# Patient Record
Sex: Male | Born: 1980 | Race: White | Hispanic: No | Marital: Single | Smoking: Current every day smoker
Health system: Southern US, Community
[De-identification: ages and names within clinical notes are randomized; demographics above are authoritative.]

## PROBLEM LIST (undated history)

## (undated) DIAGNOSIS — M419 Scoliosis, unspecified: Secondary | ICD-10-CM

## (undated) DIAGNOSIS — M1990 Unspecified osteoarthritis, unspecified site: Secondary | ICD-10-CM

## (undated) HISTORY — PX: TONSILLECTOMY: SUR1361

---

## 2014-10-29 ENCOUNTER — Encounter (HOSPITAL_COMMUNITY): Payer: Self-pay | Admitting: Emergency Medicine

## 2014-10-29 ENCOUNTER — Emergency Department (INDEPENDENT_AMBULATORY_CARE_PROVIDER_SITE_OTHER)
Admission: EM | Admit: 2014-10-29 | Discharge: 2014-10-29 | Disposition: A | Discharge status: Home / Self Care | Payer: BLUE CROSS/BLUE SHIELD | Source: Home / Self Care | Attending: Family Medicine | Admitting: Family Medicine

## 2014-10-29 DIAGNOSIS — K088 Other specified disorders of teeth and supporting structures: Secondary | ICD-10-CM

## 2014-10-29 DIAGNOSIS — K0889 Other specified disorders of teeth and supporting structures: Secondary | ICD-10-CM

## 2014-10-29 MED ORDER — AMOXICILLIN 500 MG PO CAPS: 500.0000 mg | ORAL_CAPSULE | Freq: Three times a day (TID) | ORAL | Status: DC

## 2014-10-29 MED ORDER — TRAMADOL HCL 50 MG PO TABS: 50.0000 mg | ORAL_TABLET | Freq: Four times a day (QID) | ORAL | Status: DC | PRN

## 2014-10-29 NOTE — ED Notes (Signed)
C/o dental pain/absess onset 2 days Taking ibup w/no relief Denies fevers, chills.  Alert, no signs of acute distress.

## 2014-10-29 NOTE — Discharge Instructions (Signed)
Thank you for coming in today. °[facility]: ° °[facility] - [phone] (ext [phone]) ° °[address] ° °Please call Dr. Civils office [phone] or cell [phone] °[address], [place]  °Cost for tooth removal $200 includes exam, Xray, and extraction and follow up visit.  °Bring list of current medications with you.  ° °[facility] - [phone] ° °[facility] - [phone] ° °[address] ° °[facility] ° °[address], [place], [place], [address] ° °[phone], Ext. [phone] ° °2nd and 4th Thursday of the month at 6:30am (Simple extractions only - no wisdom teeth or surgery) First come/First serve -First 10 clients served ° °[facility] ([place] residents only) ° °[address], [place], [place], [address] ° °[phone] ° °[facility] ° °[phone] ° °[facility] ° °[phone] ° °[facility] ° °[phone] ° °Please call [facility] at [phone] to get the details to get your tooth pulled.  ° ° ° °Dental Pain °A tooth ache may be caused by cavities (tooth decay). Cavities expose the nerve of the tooth to air and hot or cold temperatures. It may come from an infection or abscess (also called a boil or furuncle) around your tooth. It is also often caused by dental caries (tooth decay). This causes the pain you are having. °DIAGNOSIS  °Your caregiver can diagnose this problem by exam. °TREATMENT  °· If caused by an infection, it may be treated with medications which kill germs (antibiotics) and pain medications as prescribed by your caregiver. Take medications as directed. °· Only take over-the-counter or prescription medicines for pain, discomfort, or fever as directed by your caregiver. °· Whether the tooth ache today is caused by infection or dental disease, you should see your dentist as soon as  possible for further care. °SEEK MEDICAL CARE IF: °The exam and treatment you received today has been provided on an emergency basis only. This is not a substitute for complete medical or dental care. If your problem worsens or new problems (symptoms) appear, and you are unable to meet with your dentist, call or return to this location. °SEEK IMMEDIATE MEDICAL CARE IF:  °· You have a fever. °· You develop redness and swelling of your face, jaw, or neck. °· You are unable to open your mouth. °· You have severe pain uncontrolled by pain medicine. °MAKE SURE YOU:  °· Understand these instructions. °· Will watch your condition. °· Will get help right away if you are not doing well or get worse. °Document Released: 07/08/2005 Document Revised: 09/30/2011 Document Reviewed: 02/24/2008 °ExitCare® Patient Information ©2015 [employer]. This information is not intended to replace advice given to you by your health care provider. Make sure you discuss any questions you have with your health care provider. ° °

## 2014-10-29 NOTE — ED Provider Notes (Signed)
Barbee ShropshireBrian Landgren is a 34 y.o. male who presents to Urgent Care today for dental pain. Patient has severe dental pain in his upper right frontal teeth. He has significantly poor dentition throughout with multiple pulled teeth. He has never been to a dentist for preventative care. He has tried ibuprofen which have helped a little. His teeth began hurting significantly 2 days ago. No fevers or chills vomiting or diarrhea. He has not attempted to contact a dentist.   History reviewed. No pertinent past medical history. History reviewed. No pertinent past surgical history. History  Substance Use Topics  . Smoking status: Current Every Day Smoker -- 1.50 packs/day    Types: Cigarettes  . Smokeless tobacco: Not on file  . Alcohol Use: Yes   ROS as above Medications: No current facility-administered medications for this encounter.   Current Outpatient Prescriptions  Medication Sig Dispense Refill  . amoxicillin (AMOXIL) 500 MG capsule Take 1 capsule (500 mg total) by mouth 3 (three) times daily. 30 capsule 0  . traMADol (ULTRAM) 50 MG tablet Take 1 tablet (50 mg total) by mouth every 6 (six) hours as needed. 15 tablet 0   No Known Allergies   Exam:  BP 147/94 mmHg  Pulse 70  Temp(Src) 98.2 F (36.8 C) (Oral)  Resp 16  SpO2 97%  Gen: Well NAD HEENT: EOMI,  MMM multiple eroded teeth and gum line with gumline erythema. No obvious abscess noted. Right upper incisors tender to touch. Lungs: Normal work of breathing. CTABL Heart: RRR no MRG Abd: NABS, Soft. Nondistended, Nontender Exts: Brisk capillary refill, warm and well perfused.   No results found for this or any previous visit (from the past 24 hour(s)). No results found.  Assessment and Plan: 34 y.o. male with dental pain and infection. Treat with amoxicillin tramadol and NSAIDs. Follow-up with dentist. Return as needed.  Discussed warning signs or symptoms. Please see discharge instructions. Patient expresses  understanding.     Rodolph BongEvan S Talissa Apple, MD 10/29/14 [phone]

## 2015-07-23 HISTORY — PX: ESOPHAGOGASTRODUODENOSCOPY ENDOSCOPY: SHX5814

## 2018-01-15 ENCOUNTER — Ambulatory Visit: Payer: Self-pay | Admitting: Orthopedic Surgery

## 2018-01-15 NOTE — H&P (Signed)
Shawn White is an 37 y.o. male.   Chief Complaint: back and leg pain HPI: Reason for Visit: Diagnositc Results (lumbar MRI)  Context: The patient is 3-4 years out from when symptoms began.  Location (Lower Extremity): lower back pain  Severity: pain level 5/10  Associated Symptoms: numbness/tingling; tender to touch  Medications: The patient is currently taking Robaxin as needed at bedtime. The patient was unable to tolerate the gabapentin.  Past Medical Hx Chronic Back Pain GERD/Reflux  Past Surgical Hx Tonsillectomy  No pertinent family hx  Medications Ativan 0.5 mg tablet Norco 5 mg-325 mg tablet Robaxin 500 mg tablet  Social History:  reports that he has been smoking cigarettes.  He has been smoking about 1.50 packs per day. He does not have any smokeless tobacco history on file. He reports that he drinks alcohol. He reports that he has current or past drug history. Drug: Marijuana.  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   HENT: Negative.   Eyes: Negative.   Respiratory: Negative.   Cardiovascular: Negative.   Gastrointestinal: Negative.   Genitourinary: Negative.   Musculoskeletal: Positive for back pain.  Skin: Negative.   Neurological: Positive for sensory change and focal weakness.  Psychiatric/Behavioral: Negative.     There were no vitals taken for this visit. Physical Exam  Constitutional: He is oriented to person, place, and time. He appears well-developed and well-nourished.  HENT:  Head: Normocephalic.  Eyes: Pupils are equal, round, and reactive to light.  Neck: Normal range of motion.  Cardiovascular: Normal rate.  Respiratory: Effort normal.   GI: Soft.  Musculoskeletal:  Patient is a 37-year-old male.  Gait and Station: Appearance: ambulating with no assistive devices and antalgic gait.  Constitutional: General Appearance: healthy-appearing and distress (mild).  Psychiatric: Mood and Affect: active and alert.  Cardiovascular System: Edema Right: none; Dorsalis and posterior tibial pulses 2+. Edema Left: none.  Abdomen: Inspection and Palpation: non-distended and no tenderness.  Skin: Inspection and palpation: no rash.  Lumbar Spine: Inspection: normal alignment. Bony Palpation of the Lumbar Spine: tender at lumbosacral junction.. Bony Palpation of the Right Hip: no tenderness of the greater trochanter and tenderness of the SI joint; Pelvis stable. Bony Palpation of the Left Hip: no tenderness of the greater trochanter and tenderness of the SI joint. Soft Tissue Palpation on the Right: No flank pain with percussion. Active Range of Motion: limited flexion and extention.  Motor Strength: L1 Motor Strength on the Right: hip flexion iliopsoas 5/5. L1 Motor Strength on the Left: hip flexion iliopsoas 5/5. L2-L4 Motor Strength on the Right: knee extension quadriceps 4/5. L2-L4 Motor Strength on the Left: knee extension quadriceps 5/5. L5 Motor Strength on the Right: great toe extension extensor hallucis longus 5/5 and ankle dorsiflexion tibialis anterior 4/5. L5 Motor Strength on the Left: ankle dorsiflexion tibialis anterior 5/5 and great toe extension extensor hallucis longus 5/5. S1 Motor Strength on the Right: plantar flexion gastrocnemius 5/5. S1 Motor Strength on the Left: plantar flexion gastrocnemius 5/5.  Neurological System: Knee Reflex Right: normal (2). Knee Reflex Left: normal (2). Ankle Reflex Right: normal (2). Ankle Reflex Left: normal (2). Babinski Reflex Right: plantar reflex absent. Babinski Reflex Left: plantar reflex absent. Sensation on the Right: normal   distal extremities and decreased sensation of the knee and  medial leg (L4). Sensation on the Left: normal distal extremities. Special Tests on the Right: no clonus of the ankle/knee and seated straight leg raising test positive. Special Tests on the Left: no clonus of the ankle/knee and seated straight leg raising test positive.  Normal cervical lordosis. No pain with range of motion. No palpable tenderness. Motor is 5/5 in all groups in the upper extremities. Upper extremity sensory exam normal. Patient is normoreflexic in the upper extremities. No Hoffmann sign.  Decreased sensation L4 dermatome.  Neurological: He is alert and oriented to person, place, and time.  Skin: Skin is warm and dry.    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  Assessment/Plan 1. Pain in lumbar spine 2. Congenital postural lordosis 3. Lumbar radiculopathy  4. Degeneration of lumbar intervertebral disc 5. Scoliosis deformity of spine 6. Lumbar spondylolisthesis 7. Nicotine dependence  Patient demonstrates a refractory L4 radiculopathy with weakness in the quadriceps as well as tibialis anterior decreased sensation L4 dermatome.  He is also undergone physical therapy and a dose pack without avail  Given the presence of a neurologic deficit in the presence of a neural compressive lesion and significant pain is reasonable to proceed with a microlumbar decompression at L4-5. This that may require a removal of the hemi-lamina of 4 on the right. He has not underlying spondylolisthesis at L5-S1. And multilevel disc degeneration.  I had an extensive discussion with the patient concerning the pathology relevant anatomy and treatment options. At this point exhausting conservative treatment and in the presence of a neurologic deficit we discussed microlumbar decompression. I discussed the risks and benefits including bleeding, infection, DVT, PE, anesthetic complications, worsening in their symptoms, improvement  in their symptoms, C SF leakage, epidural fibrosis, need for future surgeries such as revision discectomy and lumbar fusion. I also indicated that this is an operation to basically decompress the nerve root to allow recovery as opposed to fixing a herniated disc and that the incidence of recurrent chest disc herniation can approach 15%. Also that nerve root recovery is variable and may not recover completely.  I discussed the operative course including overnight in the hospital. Immediate ambulation. Follow-up in 2 weeks for suture removal. 6 weeks until healing of the herniation followed by 6 weeks of reconditioning and strengthening of the core musculature. Also discussed the need to employ the concepts of disc pressure management and core motion following the surgery to minimize the risk of recurrent disc herniation. We will obtain preoperative clearance i if necessary and proceed accordingly.  I did indicate this is not for disc degeneration or back pain. And that he may require a multilevel fusion in the future. Also discussed the need to refrain from tobacco use indicating the deltoid side effects upon healing including infection scar tissue etc.  In that effort we placed him on Ativan to be utilized as an anxiolytic agent for tobacco withdrawal. He has tried other approaches in the past that have not worked.  We will have him scheduled as soon as possible.  Continue activity modification in the interim we will also place a other dose pack in the interim   Plan microlumbar decompression L4-5  BISSELL, JACLYN M., PA-C for Dr. Beane 01/15/2018, 1:59 PM   

## 2018-01-15 NOTE — H&P (View-Only) (Signed)
Shawn White is an 37 y.o. male.   Chief Complaint: back and leg pain HPI: Reason for Visit: Diagnositc Results (lumbar MRI)  Context: The patient is 3-4 years out from when symptoms began.  Location (Lower Extremity): lower back pain  Severity: pain level 5/10  Associated Symptoms: numbness/tingling; tender to touch  Medications: The patient is currently taking Robaxin as needed at bedtime. The patient was unable to tolerate the gabapentin.  Past Medical Hx Chronic Back Pain GERD/Reflux  Past Surgical Hx Tonsillectomy  No pertinent family hx  Medications Ativan 0.5 mg tablet Norco 5 mg-325 mg tablet Robaxin 500 mg tablet  Social History:  reports that he has been smoking cigarettes.  He has been smoking about 1.50 packs per day. He does not have any smokeless tobacco history on file. He reports that he drinks alcohol. He reports that he has current or past drug history. Drug: Marijuana.  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   HENT: Negative.   Eyes: Negative.   Respiratory: Negative.   Cardiovascular: Negative.   Gastrointestinal: Negative.   Genitourinary: Negative.   Musculoskeletal: Positive for back pain.  Skin: Negative.   Neurological: Positive for sensory change and focal weakness.  Psychiatric/Behavioral: Negative.     There were no vitals taken for this visit. Physical Exam  Constitutional: He is oriented to person, place, and time. He appears well-developed and well-nourished.  HENT:  Head: Normocephalic.  Eyes: Pupils are equal, round, and reactive to light.  Neck: Normal range of motion.  Cardiovascular: Normal rate.  Respiratory: Effort normal.   GI: Soft.  Musculoskeletal:  Patient is a 37 year old male.  Gait and Station: Appearance: ambulating with no assistive devices and antalgic gait.  Constitutional: General Appearance: healthy-appearing and distress (mild).  Psychiatric: Mood and Affect: active and alert.  Cardiovascular System: Edema Right: none; Dorsalis and posterior tibial pulses 2+. Edema Left: none.  Abdomen: Inspection and Palpation: non-distended and no tenderness.  Skin: Inspection and palpation: no rash.  Lumbar Spine: Inspection: normal alignment. Bony Palpation of the Lumbar Spine: tender at lumbosacral junction.. Bony Palpation of the Right Hip: no tenderness of the greater trochanter and tenderness of the SI joint; Pelvis stable. Bony Palpation of the Left Hip: no tenderness of the greater trochanter and tenderness of the SI joint. Soft Tissue Palpation on the Right: No flank pain with percussion. Active Range of Motion: limited flexion and extention.  Motor Strength: L1 Motor Strength on the Right: hip flexion iliopsoas 5/5. L1 Motor Strength on the Left: hip flexion iliopsoas 5/5. L2-L4 Motor Strength on the Right: knee extension quadriceps 4/5. L2-L4 Motor Strength on the Left: knee extension quadriceps 5/5. L5 Motor Strength on the Right: great toe extension extensor hallucis longus 5/5 and ankle dorsiflexion tibialis anterior 4/5. L5 Motor Strength on the Left: ankle dorsiflexion tibialis anterior 5/5 and great toe extension extensor hallucis longus 5/5. S1 Motor Strength on the Right: plantar flexion gastrocnemius 5/5. S1 Motor Strength on the Left: plantar flexion gastrocnemius 5/5.  Neurological System: Knee Reflex Right: normal (2). Knee Reflex Left: normal (2). Ankle Reflex Right: normal (2). Ankle Reflex Left: normal (2). Babinski Reflex Right: plantar reflex absent. Babinski Reflex Left: plantar reflex absent. Sensation on the Right: normal  distal extremities and decreased sensation of the knee and  medial leg (L4). Sensation on the Left: normal distal extremities. Special Tests on the Right: no clonus of the ankle/knee and seated straight leg raising test positive. Special Tests on the Left: no clonus of the ankle/knee and seated straight leg raising test positive.  Normal cervical lordosis. No pain with range of motion. No palpable tenderness. Motor is 5/5 in all groups in the upper extremities. Upper extremity sensory exam normal. Patient is normoreflexic in the upper extremities. No Hoffmann sign.  Decreased sensation L4 dermatome.  Neurological: He is alert and oriented to person, place, and time.  Skin: Skin is warm and dry.    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  Assessment/Plan 1. Pain in lumbar spine 2. Congenital postural lordosis 3. Lumbar radiculopathy  4. Degeneration of lumbar intervertebral disc 5. Scoliosis deformity of spine 6. Lumbar spondylolisthesis 7. Nicotine dependence  Patient demonstrates a refractory L4 radiculopathy with weakness in the quadriceps as well as tibialis anterior decreased sensation L4 dermatome.  He is also undergone physical therapy and a dose pack without avail  Given the presence of a neurologic deficit in the presence of a neural compressive lesion and significant pain is reasonable to proceed with a microlumbar decompression at L4-5. This that may require a removal of the hemi-lamina of 4 on the right. He has not underlying spondylolisthesis at L5-S1. And multilevel disc degeneration.  I had an extensive discussion with the patient concerning the pathology relevant anatomy and treatment options. At this point exhausting conservative treatment and in the presence of a neurologic deficit we discussed microlumbar decompression. I discussed the risks and benefits including bleeding, infection, DVT, PE, anesthetic complications, worsening in their symptoms, improvement  in their symptoms, C SF leakage, epidural fibrosis, need for future surgeries such as revision discectomy and lumbar fusion. I also indicated that this is an operation to basically decompress the nerve root to allow recovery as opposed to fixing a herniated disc and that the incidence of recurrent chest disc herniation can approach 15%. Also that nerve root recovery is variable and may not recover completely.  I discussed the operative course including overnight in the hospital. Immediate ambulation. Follow-up in 2 weeks for suture removal. 6 weeks until healing of the herniation followed by 6 weeks of reconditioning and strengthening of the core musculature. Also discussed the need to employ the concepts of disc pressure management and core motion following the surgery to minimize the risk of recurrent disc herniation. We will obtain preoperative clearance i if necessary and proceed accordingly.  I did indicate this is not for disc degeneration or back pain. And that he may require a multilevel fusion in the future. Also discussed the need to refrain from tobacco use indicating the deltoid side effects upon healing including infection scar tissue etc.  In that effort we placed him on Ativan to be utilized as an anxiolytic agent for tobacco withdrawal. He has tried other approaches in the past that have not worked.  We will have him scheduled as soon as possible.  Continue activity modification in the interim we will also place a other dose pack in the interim   Plan microlumbar decompression L4-5  Dorothy SparkBISSELL, Traves Majchrzak M., PA-C for Dr. Shelle IronBeane 01/15/2018, 1:59 PM

## 2018-01-19 NOTE — Pre-Procedure Instructions (Signed)
Shawn White  01/19/2018      [facility] - [place] [place] [id number] Phone: [phone] Fax: [phone]    Your procedure is scheduled on January 29, 2018.  [facility] at [facility]30 AM.  Call this number if you have problems the morning of surgery:  [phone]   Remember:  Do not eat or drink after midnight.    Take these medicines the morning of surgery with A SIP OF WATER  Hydrocodone-acetaminophen (norco)-if needed for pain Ativan-if needed Methocarbamol (robaxin)-if needed for muscle spasms  7 days prior to surgery STOP taking any Aspirin (unless otherwise instructed by your surgeon), Aleve, Naproxen, Ibuprofen, Motrin, Advil, Goody's, BC's, all herbal medications, fish oil, and all vitamins    Do not wear jewelry  Do not wear lotions, powders, or colognes, or deodorant.  Men may shave face and neck.  Do not bring valuables to the hospital.  [facility] any belongings or valuables.  Contacts, dentures or bridgework may not be worn into surgery.  Leave your suitcase in the car.  After surgery it may be brought to your room.  For patients admitted to the hospital, discharge time will be determined by your treatment team.  Patients discharged the day of surgery will not be allowed to drive home.    [facility]- Preparing For Surgery  Before surgery, you can play an important role. Because skin is not sterile, your skin needs to be as free of germs as possible. You can reduce the number of germs on your skin by washing with CHG (chlorahexidine gluconate) Soap before surgery.  CHG is an antiseptic cleaner which kills germs and bonds with the skin to continue killing germs even after washing.    Oral Hygiene is also important to reduce your risk of infection.  Remember - BRUSH YOUR TEETH THE MORNING OF SURGERY WITH YOUR REGULAR TOOTHPASTE  Please do not use if  you have an allergy to CHG or antibacterial soaps. If your skin becomes reddened/irritated stop using the CHG.  Do not shave (including legs and underarms) for at least 48 hours prior to first CHG shower. It is OK to shave your face.  Please follow these instructions carefully.   1. Shower the NIGHT BEFORE SURGERY and the MORNING OF SURGERY with CHG.   2. If you chose to wash your hair, wash your hair first as usual with your normal shampoo.  3. After you shampoo, rinse your hair and body thoroughly to remove the shampoo.  4. Use CHG as you would any other liquid soap. You can apply CHG directly to the skin and wash gently with a scrungie or a clean washcloth.   5. Apply the CHG Soap to your body ONLY FROM THE NECK DOWN.  Do not use on open wounds or open sores. Avoid contact with your eyes, ears, mouth and genitals (private parts). Wash Face and genitals (private parts)  with your normal soap.  6. Wash thoroughly, paying special attention to the area where your surgery will be performed.  7. Thoroughly rinse your body with warm water from the neck down.  8. DO NOT shower/wash with your normal soap after using and rinsing off the CHG Soap.  9. Pat yourself dry with a CLEAN TOWEL.  10. Wear CLEAN PAJAMAS to bed the night before surgery, wear comfortable clothes the morning of surgery  11. Place  CLEAN SHEETS on your bed the night of your first shower and DO NOT SLEEP WITH PETS.  Day of Surgery:  Do not apply any deodorants/lotions.  Please wear clean clothes to the hospital/surgery center.   Remember to brush your teeth WITH YOUR REGULAR TOOTHPASTE.  Please read over the following fact sheets that you were given. Pain Booklet, Coughing and Deep Breathing, MRSA Information and Surgical Site Infection Prevention

## 2018-01-19 NOTE — Progress Notes (Addendum)
PCP: pt denies   Cardiologist: pt denies  EKG: pt denies past year,  Stress test: pt denies  ECHO: pt denies  Cardiac Cath: pt denies  Chest x-ray: pt denies past year, no recent respiratory infections/complications

## 2018-01-20 ENCOUNTER — Encounter (HOSPITAL_COMMUNITY)
Admission: RE | Admit: 2018-01-20 | Discharge: 2018-01-20 | Disposition: A | Discharge status: Home / Self Care | Payer: BLUE CROSS/BLUE SHIELD | Source: Ambulatory Visit | Attending: Specialist | Admitting: Specialist

## 2018-01-20 ENCOUNTER — Ambulatory Visit (HOSPITAL_COMMUNITY)
Admission: RE | Admit: 2018-01-20 | Discharge: 2018-01-20 | Disposition: A | Discharge status: Home / Self Care | Payer: BLUE CROSS/BLUE SHIELD | Source: Ambulatory Visit | Attending: Orthopedic Surgery | Admitting: Orthopedic Surgery

## 2018-01-20 ENCOUNTER — Encounter (HOSPITAL_COMMUNITY): Payer: Self-pay

## 2018-01-20 ENCOUNTER — Other Ambulatory Visit: Payer: Self-pay

## 2018-01-20 DIAGNOSIS — Z01818 Encounter for other preprocedural examination: Secondary | ICD-10-CM | POA: Diagnosis present

## 2018-01-20 DIAGNOSIS — M47894 Other spondylosis, thoracic region: Secondary | ICD-10-CM | POA: Insufficient documentation

## 2018-01-20 DIAGNOSIS — M5126 Other intervertebral disc displacement, lumbar region: Secondary | ICD-10-CM | POA: Diagnosis present

## 2018-01-20 HISTORY — DX: Unspecified osteoarthritis, unspecified site: M19.90

## 2018-01-20 LAB — BASIC METABOLIC PANEL
ANION GAP: 8 (ref 5–15)
BUN: 10 mg/dL (ref 6–20)
CO2: 25 mmol/L (ref 22–32)
Calcium: 8.9 mg/dL (ref 8.9–10.3)
Chloride: 107 mmol/L (ref 98–111)
Creatinine, Ser: 0.72 mg/dL (ref 0.61–1.24)
GFR calc Af Amer: >60 mL/min (ref >60)
Glucose, Bld: 82 mg/dL (ref 70–99)
Potassium: 4.5 mmol/L (ref 3.5–5.1)
Sodium: 140 mmol/L (ref 135–145)

## 2018-01-20 LAB — CBC
HCT: 45 % (ref 39.0–52.0)
HEMOGLOBIN: 14.5 g/dL (ref 13.0–17.0)
MCH: 30.3 pg (ref 26.0–34.0)
MCHC: 32.2 g/dL (ref 30.0–36.0)
MCV: 93.9 fL (ref 78.0–100.0)
Platelets: 363 10*3/uL (ref 150–400)
RBC: 4.79 MIL/uL (ref 4.22–5.81)
RDW: 12.9 % (ref 11.5–15.5)
WBC: 7 10*3/uL (ref 4.0–10.5)

## 2018-01-20 LAB — SURGICAL PCR SCREEN
MRSA, PCR: NEGATIVE
STAPHYLOCOCCUS AUREUS: NEGATIVE

## 2018-01-29 ENCOUNTER — Encounter (HOSPITAL_COMMUNITY)
Admission: RE | Disposition: A | Discharge status: Home / Self Care | Payer: Self-pay | Source: Ambulatory Visit | Attending: Specialist

## 2018-01-29 ENCOUNTER — Ambulatory Visit (HOSPITAL_COMMUNITY)
Admission: RE | Admit: 2018-01-29 | Discharge: 2018-01-30 | Disposition: A | Discharge status: Home / Self Care | Payer: BLUE CROSS/BLUE SHIELD | Source: Ambulatory Visit | Attending: Specialist | Admitting: Specialist

## 2018-01-29 ENCOUNTER — Encounter (HOSPITAL_COMMUNITY): Payer: Self-pay | Admitting: *Deleted

## 2018-01-29 ENCOUNTER — Ambulatory Visit (HOSPITAL_COMMUNITY): Payer: BLUE CROSS/BLUE SHIELD

## 2018-01-29 ENCOUNTER — Other Ambulatory Visit: Payer: Self-pay

## 2018-01-29 ENCOUNTER — Ambulatory Visit (HOSPITAL_COMMUNITY): Payer: BLUE CROSS/BLUE SHIELD | Admitting: Certified Registered"

## 2018-01-29 DIAGNOSIS — M419 Scoliosis, unspecified: Secondary | ICD-10-CM | POA: Diagnosis not present

## 2018-01-29 DIAGNOSIS — M5126 Other intervertebral disc displacement, lumbar region: Secondary | ICD-10-CM | POA: Diagnosis present

## 2018-01-29 DIAGNOSIS — M4317 Spondylolisthesis, lumbosacral region: Secondary | ICD-10-CM | POA: Insufficient documentation

## 2018-01-29 DIAGNOSIS — M4316 Spondylolisthesis, lumbar region: Secondary | ICD-10-CM | POA: Insufficient documentation

## 2018-01-29 DIAGNOSIS — M5116 Intervertebral disc disorders with radiculopathy, lumbar region: Secondary | ICD-10-CM | POA: Diagnosis not present

## 2018-01-29 DIAGNOSIS — K219 Gastro-esophageal reflux disease without esophagitis: Secondary | ICD-10-CM | POA: Diagnosis not present

## 2018-01-29 DIAGNOSIS — Z79899 Other long term (current) drug therapy: Secondary | ICD-10-CM | POA: Insufficient documentation

## 2018-01-29 DIAGNOSIS — F17210 Nicotine dependence, cigarettes, uncomplicated: Secondary | ICD-10-CM | POA: Insufficient documentation

## 2018-01-29 DIAGNOSIS — Z888 Allergy status to other drugs, medicaments and biological substances status: Secondary | ICD-10-CM | POA: Diagnosis not present

## 2018-01-29 DIAGNOSIS — M48061 Spinal stenosis, lumbar region without neurogenic claudication: Secondary | ICD-10-CM | POA: Diagnosis not present

## 2018-01-29 DIAGNOSIS — Z419 Encounter for procedure for purposes other than remedying health state, unspecified: Secondary | ICD-10-CM

## 2018-01-29 DIAGNOSIS — M1990 Unspecified osteoarthritis, unspecified site: Secondary | ICD-10-CM | POA: Diagnosis not present

## 2018-01-29 HISTORY — PX: LUMBAR DISC SURGERY: SHX700

## 2018-01-29 HISTORY — PX: LUMBAR LAMINECTOMY/DECOMPRESSION MICRODISCECTOMY: SHX5026

## 2018-01-29 HISTORY — DX: Scoliosis, unspecified: M41.9

## 2018-01-29 SURGERY — LUMBAR LAMINECTOMY/DECOMPRESSION MICRODISCECTOMY 1 LEVEL
Anesthesia: General | Site: Spine Lumbar

## 2018-01-29 MED ORDER — LORAZEPAM 0.5 MG PO TABS
0.5000 mg | ORAL_TABLET | Freq: Three times a day (TID) | ORAL | Status: DC | PRN
  Administered 2018-01-29: 0.5 mg via ORAL
  Filled 2018-01-29: qty 1

## 2018-01-29 MED ORDER — RISAQUAD PO CAPS
1.0000 | ORAL_CAPSULE | Freq: Every day | ORAL | Status: DC
  Administered 2018-01-30: 1 via ORAL
  Filled 2018-01-29: qty 1

## 2018-01-29 MED ORDER — SODIUM CHLORIDE 0.9 % IV SOLN
INTRAVENOUS | Status: DC | PRN
  Administered 2018-01-29: 30 ug/min via INTRAVENOUS

## 2018-01-29 MED ORDER — CEFAZOLIN SODIUM-DEXTROSE 2-4 GM/100ML-% IV SOLN: 2.0000 g | INTRAVENOUS | Status: DC

## 2018-01-29 MED ORDER — POLYETHYLENE GLYCOL 3350 17 G PO PACK: 17.0000 g | PACK | Freq: Every day | ORAL | 1 refills | Status: AC

## 2018-01-29 MED ORDER — DEXAMETHASONE SODIUM PHOSPHATE 10 MG/ML IJ SOLN
INTRAMUSCULAR | Status: DC | PRN
  Administered 2018-01-29: 10 mg via INTRAVENOUS

## 2018-01-29 MED ORDER — GLYCOPYRROLATE PF 0.2 MG/ML IJ SOSY
PREFILLED_SYRINGE | INTRAMUSCULAR | Status: AC
  Filled 2018-01-29: qty 2

## 2018-01-29 MED ORDER — VANCOMYCIN HCL IN DEXTROSE 1-5 GM/200ML-% IV SOLN
INTRAVENOUS | Status: AC
  Filled 2018-01-29: qty 200

## 2018-01-29 MED ORDER — THROMBIN 5000 UNITS EX SOLR
CUTANEOUS | Status: AC
  Filled 2018-01-29: qty 20000

## 2018-01-29 MED ORDER — OXYCODONE HCL 5 MG PO TABS
ORAL_TABLET | ORAL | Status: AC
  Filled 2018-01-29: qty 1

## 2018-01-29 MED ORDER — ACETAMINOPHEN 325 MG PO TABS
650.0000 mg | ORAL_TABLET | ORAL | Status: DC | PRN
  Administered 2018-01-29: 650 mg via ORAL
  Filled 2018-01-29: qty 2

## 2018-01-29 MED ORDER — BISACODYL 5 MG PO TBEC
5.0000 mg | DELAYED_RELEASE_TABLET | Freq: Every day | ORAL | Status: DC | PRN
  Administered 2018-01-30: 5 mg via ORAL
  Filled 2018-01-29: qty 1

## 2018-01-29 MED ORDER — METHOCARBAMOL 500 MG PO TABS: 500.0000 mg | ORAL_TABLET | Freq: Four times a day (QID) | ORAL | 1 refills | Status: AC | PRN

## 2018-01-29 MED ORDER — MIDAZOLAM HCL 2 MG/2ML IJ SOLN
INTRAMUSCULAR | Status: AC
  Filled 2018-01-29: qty 2

## 2018-01-29 MED ORDER — FENTANYL CITRATE (PF) 100 MCG/2ML IJ SOLN
INTRAMUSCULAR | Status: DC | PRN
  Administered 2018-01-29: 200 ug via INTRAVENOUS
  Administered 2018-01-29 (×2): 25 ug via INTRAVENOUS

## 2018-01-29 MED ORDER — OXYCODONE HCL 5 MG PO TABS
5.0000 mg | ORAL_TABLET | ORAL | Status: DC | PRN
  Administered 2018-01-29 – 2018-01-30 (×4): 10 mg via ORAL
  Filled 2018-01-29 (×4): qty 2

## 2018-01-29 MED ORDER — HYDROMORPHONE HCL 1 MG/ML IJ SOLN
0.2500 mg | INTRAMUSCULAR | Status: DC | PRN
Start: 2018-01-29 — End: 2018-01-29
  Administered 2018-01-29 (×2): 0.5 mg via INTRAVENOUS

## 2018-01-29 MED ORDER — METHOCARBAMOL 1000 MG/10ML IJ SOLN
500.0000 mg | Freq: Four times a day (QID) | INTRAVENOUS | Status: DC | PRN
  Filled 2018-01-29: qty 5

## 2018-01-29 MED ORDER — ALUM & MAG HYDROXIDE-SIMETH 200-200-20 MG/5ML PO SUSP: 30.0000 mL | Freq: Four times a day (QID) | ORAL | Status: DC | PRN

## 2018-01-29 MED ORDER — MAGNESIUM CITRATE PO SOLN: 1.0000 | Freq: Once | ORAL | Status: DC | PRN

## 2018-01-29 MED ORDER — BUPIVACAINE-EPINEPHRINE 0.5% -1:200000 IJ SOLN
INTRAMUSCULAR | Status: DC | PRN
  Administered 2018-01-29: 6 mL

## 2018-01-29 MED ORDER — FENTANYL CITRATE (PF) 250 MCG/5ML IJ SOLN
INTRAMUSCULAR | Status: AC
  Filled 2018-01-29: qty 5

## 2018-01-29 MED ORDER — ACETAMINOPHEN 650 MG RE SUPP: 650.0000 mg | RECTAL | Status: DC | PRN

## 2018-01-29 MED ORDER — 0.9 % SODIUM CHLORIDE (POUR BTL) OPTIME
TOPICAL | Status: DC | PRN
  Administered 2018-01-29: 1000 mL

## 2018-01-29 MED ORDER — SUGAMMADEX SODIUM 200 MG/2ML IV SOLN
INTRAVENOUS | Status: AC
  Filled 2018-01-29: qty 2

## 2018-01-29 MED ORDER — PROPOFOL 10 MG/ML IV BOLUS
INTRAVENOUS | Status: AC
  Filled 2018-01-29: qty 20

## 2018-01-29 MED ORDER — CEFAZOLIN SODIUM-DEXTROSE 2-4 GM/100ML-% IV SOLN
2.0000 g | INTRAVENOUS | Status: AC
  Administered 2018-01-29: 2 g via INTRAVENOUS
  Filled 2018-01-29: qty 100

## 2018-01-29 MED ORDER — CEFAZOLIN SODIUM-DEXTROSE 2-4 GM/100ML-% IV SOLN
2.0000 g | Freq: Three times a day (TID) | INTRAVENOUS | Status: AC
Start: 2018-01-29 — End: 2018-01-30
  Administered 2018-01-29 – 2018-01-30 (×2): 2 g via INTRAVENOUS
  Filled 2018-01-29 (×2): qty 100

## 2018-01-29 MED ORDER — ONDANSETRON HCL 4 MG/2ML IJ SOLN
4.0000 mg | Freq: Four times a day (QID) | INTRAMUSCULAR | Status: DC | PRN
  Administered 2018-01-29 – 2018-01-30 (×2): 4 mg via INTRAVENOUS
  Filled 2018-01-29 (×2): qty 2

## 2018-01-29 MED ORDER — ONDANSETRON HCL 4 MG/2ML IJ SOLN
INTRAMUSCULAR | Status: DC | PRN
  Administered 2018-01-29: 4 mg via INTRAVENOUS

## 2018-01-29 MED ORDER — PROPOFOL 10 MG/ML IV BOLUS
INTRAVENOUS | Status: DC | PRN
  Administered 2018-01-29 (×2): 100 mg via INTRAVENOUS

## 2018-01-29 MED ORDER — PROMETHAZINE HCL 25 MG/ML IJ SOLN: 6.2500 mg | INTRAMUSCULAR | Status: DC | PRN

## 2018-01-29 MED ORDER — ROCURONIUM BROMIDE 100 MG/10ML IV SOLN
INTRAVENOUS | Status: DC | PRN
  Administered 2018-01-29 (×2): 10 mg via INTRAVENOUS
  Administered 2018-01-29: 50 mg via INTRAVENOUS
  Administered 2018-01-29 (×2): 10 mg via INTRAVENOUS

## 2018-01-29 MED ORDER — THROMBIN 5000 UNITS EX SOLR
CUTANEOUS | Status: DC | PRN
  Administered 2018-01-29 (×4): 5000 [IU] via TOPICAL

## 2018-01-29 MED ORDER — ONDANSETRON HCL 4 MG PO TABS: 4.0000 mg | ORAL_TABLET | Freq: Four times a day (QID) | ORAL | Status: DC | PRN

## 2018-01-29 MED ORDER — MENTHOL 3 MG MT LOZG: 1.0000 | LOZENGE | OROMUCOSAL | Status: DC | PRN

## 2018-01-29 MED ORDER — KCL IN DEXTROSE-NACL 20-5-0.45 MEQ/L-%-% IV SOLN
INTRAVENOUS | Status: DC
  Administered 2018-01-29: 16:00:00 via INTRAVENOUS
  Filled 2018-01-29: qty 1000

## 2018-01-29 MED ORDER — BUPIVACAINE-EPINEPHRINE (PF) 0.5% -1:200000 IJ SOLN
INTRAMUSCULAR | Status: AC
  Filled 2018-01-29: qty 30

## 2018-01-29 MED ORDER — LIDOCAINE HCL (CARDIAC) PF 100 MG/5ML IV SOSY
PREFILLED_SYRINGE | INTRAVENOUS | Status: DC | PRN
  Administered 2018-01-29: 80 mg via INTRAVENOUS
  Administered 2018-01-29: 20 mg via INTRAVENOUS

## 2018-01-29 MED ORDER — POLYETHYLENE GLYCOL 3350 17 G PO PACK: 17.0000 g | PACK | Freq: Every day | ORAL | Status: DC | PRN

## 2018-01-29 MED ORDER — DOCUSATE SODIUM 100 MG PO CAPS: 100.0000 mg | ORAL_CAPSULE | Freq: Two times a day (BID) | ORAL | 1 refills | Status: AC | PRN

## 2018-01-29 MED ORDER — MIDAZOLAM HCL 5 MG/5ML IJ SOLN
INTRAMUSCULAR | Status: DC | PRN
  Administered 2018-01-29: 2 mg via INTRAVENOUS

## 2018-01-29 MED ORDER — METHOCARBAMOL 500 MG PO TABS
ORAL_TABLET | ORAL | Status: AC
  Filled 2018-01-29: qty 1

## 2018-01-29 MED ORDER — LACTATED RINGERS IV SOLN
INTRAVENOUS | Status: DC
  Administered 2018-01-29 (×2): via INTRAVENOUS

## 2018-01-29 MED ORDER — OXYCODONE HCL 5 MG/5ML PO SOLN: 5.0000 mg | Freq: Once | ORAL | Status: AC | PRN

## 2018-01-29 MED ORDER — OXYCODONE HCL 5 MG PO TABS: 5.0000 mg | ORAL_TABLET | ORAL | 0 refills | Status: AC | PRN

## 2018-01-29 MED ORDER — HEMOSTATIC AGENTS (NO CHARGE) OPTIME
TOPICAL | Status: DC | PRN
  Administered 2018-01-29: 1 via TOPICAL

## 2018-01-29 MED ORDER — VANCOMYCIN HCL IN DEXTROSE 1-5 GM/200ML-% IV SOLN: 1000.0000 mg | INTRAVENOUS | Status: DC

## 2018-01-29 MED ORDER — PHENOL 1.4 % MT LIQD: 1.0000 | OROMUCOSAL | Status: DC | PRN

## 2018-01-29 MED ORDER — GLYCOPYRROLATE 0.2 MG/ML IJ SOLN
INTRAMUSCULAR | Status: DC | PRN
  Administered 2018-01-29: 0.2 mg via INTRAVENOUS

## 2018-01-29 MED ORDER — DOCUSATE SODIUM 100 MG PO CAPS
100.0000 mg | ORAL_CAPSULE | Freq: Two times a day (BID) | ORAL | Status: DC
  Administered 2018-01-29 – 2018-01-30 (×2): 100 mg via ORAL
  Filled 2018-01-29 (×2): qty 1

## 2018-01-29 MED ORDER — SUGAMMADEX SODIUM 500 MG/5ML IV SOLN
INTRAVENOUS | Status: DC | PRN
  Administered 2018-01-29: 200 mg via INTRAVENOUS

## 2018-01-29 MED ORDER — DEXMEDETOMIDINE HCL 200 MCG/2ML IV SOLN
INTRAVENOUS | Status: DC | PRN
  Administered 2018-01-29 (×5): 8 ug via INTRAVENOUS

## 2018-01-29 MED ORDER — ACETAMINOPHEN 10 MG/ML IV SOLN
1000.0000 mg | INTRAVENOUS | Status: AC
  Administered 2018-01-29: 1000 mg via INTRAVENOUS
  Filled 2018-01-29: qty 100

## 2018-01-29 MED ORDER — METHOCARBAMOL 500 MG PO TABS
500.0000 mg | ORAL_TABLET | Freq: Four times a day (QID) | ORAL | Status: DC | PRN
  Administered 2018-01-29 (×2): 500 mg via ORAL
  Filled 2018-01-29: qty 1

## 2018-01-29 MED ORDER — HYDROMORPHONE HCL 1 MG/ML IJ SOLN
INTRAMUSCULAR | Status: AC
  Filled 2018-01-29: qty 1

## 2018-01-29 MED ORDER — HYDROMORPHONE HCL 1 MG/ML IJ SOLN
1.0000 mg | INTRAMUSCULAR | Status: DC | PRN
  Administered 2018-01-29 (×2): 1 mg via INTRAVENOUS
  Filled 2018-01-29 (×2): qty 1

## 2018-01-29 MED ORDER — BACITRACIN 50000 UNITS IM SOLR
INTRAMUSCULAR | Status: DC | PRN
  Administered 2018-01-29: 500 mL

## 2018-01-29 MED ORDER — OXYCODONE HCL 5 MG PO TABS
5.0000 mg | ORAL_TABLET | Freq: Once | ORAL | Status: AC | PRN
  Administered 2018-01-29: 5 mg via ORAL

## 2018-01-29 MED ORDER — LACTATED RINGERS IV SOLN
INTRAVENOUS | Status: DC
  Administered 2018-01-29: 09:00:00 via INTRAVENOUS

## 2018-01-29 SURGICAL SUPPLY — 58 items
BAG DECANTER FOR FLEXI CONT (MISCELLANEOUS) ×3 IMPLANT
CLEANER TIP ELECTROSURG 2X2 (MISCELLANEOUS) ×3 IMPLANT
CLOSURE WOUND 1/2 X4 (GAUZE/BANDAGES/DRESSINGS) ×1
CLOTH 2% CHLOROHEXIDINE 3PK (PERSONAL CARE ITEMS) ×3 IMPLANT
CONT SPEC 4OZ CLIKSEAL STRL BL (MISCELLANEOUS) ×3 IMPLANT
DRAPE LAPAROTOMY 100X72X124 (DRAPES) ×3 IMPLANT
DRAPE MICROSCOPE LEICA (MISCELLANEOUS) ×3 IMPLANT
DRAPE SHEET LG 3/4 BI-LAMINATE (DRAPES) ×3 IMPLANT
DRAPE SURG 17X11 SM STRL (DRAPES) ×3 IMPLANT
DRAPE UTILITY XL STRL (DRAPES) ×3 IMPLANT
DRSG AQUACEL AG ADV 3.5X 4 (GAUZE/BANDAGES/DRESSINGS) ×3 IMPLANT
DRSG AQUACEL AG ADV 3.5X 6 (GAUZE/BANDAGES/DRESSINGS) IMPLANT
DRSG TELFA 3X8 NADH (GAUZE/BANDAGES/DRESSINGS) IMPLANT
DURAPREP 26ML APPLICATOR (WOUND CARE) ×3 IMPLANT
DURASEAL SPINE SEALANT 3ML (MISCELLANEOUS) IMPLANT
ELECT BLADE 4.0 EZ CLEAN MEGAD (MISCELLANEOUS) ×3
ELECT REM PT RETURN 9FT ADLT (ELECTROSURGICAL) ×3
ELECTRODE BLDE 4.0 EZ CLN MEGD (MISCELLANEOUS) ×1 IMPLANT
ELECTRODE REM PT RTRN 9FT ADLT (ELECTROSURGICAL) ×1 IMPLANT
GLOVE BIO SURGEON STRL SZ 6.5 (GLOVE) ×2 IMPLANT
GLOVE BIO SURGEONS STRL SZ 6.5 (GLOVE) ×1
GLOVE BIOGEL PI IND STRL 6.5 (GLOVE) ×1 IMPLANT
GLOVE BIOGEL PI IND STRL 7.0 (GLOVE) ×1 IMPLANT
GLOVE BIOGEL PI IND STRL 7.5 (GLOVE) ×1 IMPLANT
GLOVE BIOGEL PI INDICATOR 6.5 (GLOVE) ×2
GLOVE BIOGEL PI INDICATOR 7.0 (GLOVE) ×2
GLOVE BIOGEL PI INDICATOR 7.5 (GLOVE) ×2
GLOVE SURG SS PI 7.0 STRL IVOR (GLOVE) ×3 IMPLANT
GLOVE SURG SS PI 7.5 STRL IVOR (GLOVE) ×3 IMPLANT
GLOVE SURG SS PI 8.0 STRL IVOR (GLOVE) ×6 IMPLANT
GOWN STRL REUS W/ TWL LRG LVL3 (GOWN DISPOSABLE) ×4 IMPLANT
GOWN STRL REUS W/ TWL XL LVL3 (GOWN DISPOSABLE) IMPLANT
GOWN STRL REUS W/TWL LRG LVL3 (GOWN DISPOSABLE) ×8
GOWN STRL REUS W/TWL XL LVL3 (GOWN DISPOSABLE)
IV CATH 14GX2 1/4 (CATHETERS) ×3 IMPLANT
KIT BASIN OR (CUSTOM PROCEDURE TRAY) ×3 IMPLANT
KIT POSITION SURG JACKSON T1 (MISCELLANEOUS) ×3 IMPLANT
NEEDLE 22X1 1/2 (OR ONLY) (NEEDLE) ×3 IMPLANT
NEEDLE SPNL 18GX3.5 QUINCKE PK (NEEDLE) ×6 IMPLANT
PACK LAMINECTOMY NEURO (CUSTOM PROCEDURE TRAY) ×3 IMPLANT
PATTIES SURGICAL .75X.75 (GAUZE/BANDAGES/DRESSINGS) ×3 IMPLANT
RUBBERBAND STERILE (MISCELLANEOUS) ×6 IMPLANT
SPONGE LAP 4X18 RFD (DISPOSABLE) IMPLANT
SPONGE SURGIFOAM ABS GEL 100 (HEMOSTASIS) ×3 IMPLANT
STAPLER VISISTAT (STAPLE) IMPLANT
STRIP CLOSURE SKIN 1/2X4 (GAUZE/BANDAGES/DRESSINGS) ×2 IMPLANT
SUT NURALON 4 0 TR CR/8 (SUTURE) IMPLANT
SUT PROLENE 3 0 PS 2 (SUTURE) ×3 IMPLANT
SUT VIC AB 1 CT1 27 (SUTURE) ×4
SUT VIC AB 1 CT1 27XBRD ANTBC (SUTURE) ×2 IMPLANT
SUT VIC AB 1-0 CT2 27 (SUTURE) IMPLANT
SUT VIC AB 2-0 CT1 27 (SUTURE)
SUT VIC AB 2-0 CT1 TAPERPNT 27 (SUTURE) IMPLANT
SUT VIC AB 2-0 CT2 27 (SUTURE) ×3 IMPLANT
SYR 3ML LL SCALE MARK (SYRINGE) ×3 IMPLANT
TOWEL GREEN STERILE (TOWEL DISPOSABLE) ×3 IMPLANT
TOWEL GREEN STERILE FF (TOWEL DISPOSABLE) ×3 IMPLANT
YANKAUER SUCT BULB TIP NO VENT (SUCTIONS) ×3 IMPLANT

## 2018-01-29 NOTE — Transfer of Care (Signed)
Immediate Anesthesia Transfer of Care Note  Patient: Shawn White  Procedure(s) Performed: Microlumbar decompression Lumbar four-Five (N/A Spine Lumbar)  Patient Location: PACU  Anesthesia Type:General  Level of Consciousness: awake, alert  and oriented  Airway & Oxygen Therapy: Patient Spontanous Breathing and Patient connected to face mask oxygen  Post-op Assessment: Report given to RN, Post -op Vital signs reviewed and stable and Patient moving all extremities X 4  Post vital signs: Reviewed and stable  Last Vitals:  Vitals Value Taken Time  BP    Temp    Pulse    Resp    SpO2      Last Pain:  Vitals:   01/29/18 0816  TempSrc: Oral         Complications: No apparent anesthesia complications

## 2018-01-29 NOTE — Discharge Instructions (Signed)

## 2018-01-29 NOTE — Progress Notes (Signed)
Pt noted to have saturated aquacel dressing to back. Small spot of blood visible on the sheets. Called IT trainerrtho surgeon. Orders to reinforce the dressing with ABD and tape and apply ice pack to the area. Pt alert, oriented, denies numbness/tingling at this time. Able to walk to and from the bathroom. Will continue to monitor.

## 2018-01-29 NOTE — Brief Op Note (Signed)
01/29/2018  12:57 PM  PATIENT:  Barbee ShropshireBrian Buttram  [age] y.o. 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  POST-OPERATIVE DIAGNOSIS:  Herniated Nucleous Pulposus Lumbar Four-Five  PROCEDURE:  Procedure(s): Microlumbar decompression Lumbar four-Five (N/A)  SURGEON:  Surgeon(s) and Role:    Jene Every* Dorie Ohms, MD - Primary  PHYSICIAN ASSISTANT:   ASSISTANTS: Bissell   ANESTHESIA:   general  EBL:  25  BLOOD ADMINISTERED:none  DRAINS: none   LOCAL MEDICATIONS USED:  MARCAINE     SPECIMEN:  Source of Specimen:  L45  DISPOSITION OF SPECIMEN:  PATHOLOGY  COUNTS:  YES  TOURNIQUET:  * No tourniquets in log *  DICTATION: .Other Dictation: Dictation Number [id number]  PLAN OF CARE: Admit for overnight observation  PATIENT DISPOSITION:  PACU - hemodynamically stable.   Delay start of Pharmacological VTE agent (>24hrs) due to surgical blood loss or risk of bleeding: yes

## 2018-01-29 NOTE — Anesthesia Preprocedure Evaluation (Addendum)
Anesthesia Evaluation  Patient identified by MRN, date of birth, ID band Patient awake    Reviewed: Allergy & Precautions, NPO status , Patient's Chart, lab work & pertinent test results  Airway Mallampati: II  TM Distance: >3 FB Neck ROM: Full    Dental  (+) Poor Dentition, Missing   Pulmonary Current Smoker,    Pulmonary exam normal breath sounds clear to auscultation       Cardiovascular negative cardio ROS Normal cardiovascular exam Rhythm:Regular Rate:Normal     Neuro/Psych negative neurological ROS  negative psych ROS   GI/Hepatic negative GI ROS, Neg liver ROS,   Endo/Other  negative endocrine ROS  Renal/GU negative Renal ROS     Musculoskeletal negative musculoskeletal ROS (+)   Abdominal   Peds  Hematology negative hematology ROS (+)   Anesthesia Other Findings HNP L4-5  Reproductive/Obstetrics                            Anesthesia Physical Anesthesia Plan  ASA: II  Anesthesia Plan: General   Post-op Pain Management:    Induction: Intravenous  PONV Risk Score and Plan: 1 and Midazolam, Dexamethasone, Ondansetron and Treatment may vary due to age or medical condition  Airway Management Planned: Oral ETT  Additional Equipment:   Intra-op Plan:   Post-operative Plan: Extubation in OR  Informed Consent: I have reviewed the patients History and Physical, chart, labs and discussed the procedure including the risks, benefits and alternatives for the proposed anesthesia with the patient or authorized representative who has indicated his/her understanding and acceptance.   Dental advisory given  Plan Discussed with: CRNA  Anesthesia Plan Comments:         Anesthesia Quick Evaluation

## 2018-01-29 NOTE — Plan of Care (Addendum)
1420 Received pt from PACU, A&O x4. Denies numbness or tingling to BLE, with good and equal dorsiflexion, no weakness.   Surgical pain is not controlled. Placed a call to OmnicomJaclyn Bissell PA. New pain meds ordered.     Problem: Safety: Goal: Ability to remain free from injury will improve Outcome: Progressing

## 2018-01-29 NOTE — Progress Notes (Signed)
Patient has vancomycin and Ancef ordered for prophylactic antibiotics.  Patient denies penicillin allergy and denies history of MRSA.  Dr. Shelle IronBeane notified and received verbal order to discontinue vancomycin.

## 2018-01-29 NOTE — Evaluation (Signed)
Physical Therapy Evaluation and Discharge  Patient Details Name: Shawn White MRN: [id number] DOB: [birth year] Today's Date: 01/29/2018   History of Present Illness  Pt is a 37 y/o male s/p L4-5 microlumbar decompression and microdiskectomy. PMH includes current smoker.   Clinical Impression  Patient evaluated by Physical Therapy with no further acute PT needs identified. All education has been completed and the patient has no further questions. Pt requiring supervision for all mobility and overall steady. Reviewed back precautions and supine HEP. Reports brother and other family can help as needed upon d/c. See below for any follow-up Physical Therapy or equipment needs. PT is signing off. Thank you for this referral. If needs change, please reconsult.     Follow Up Recommendations No PT follow up    Equipment Recommendations  None recommended by PT    Recommendations for Other Services       Precautions / Restrictions Precautions Precautions: Back Precaution Booklet Issued: Yes (comment) Precaution Comments: Reviewed back precautions with pt and family.  Restrictions Weight Bearing Restrictions: No      Mobility  Bed Mobility Overal bed mobility: Needs Assistance Bed Mobility: Rolling;Sidelying to Sit;Sit to Sidelying Rolling: Supervision Sidelying to sit: Supervision     Sit to sidelying: Supervision General bed mobility comments: Supervision to ensure log roll technique.   Transfers Overall transfer level: Needs assistance Equipment used: None Transfers: Sit to/from Stand Sit to Stand: Supervision         General transfer comment: Supervision for safety. No LOB noted.   Ambulation/Gait Ambulation/Gait assistance: Supervision Gait Distance (Feet): 200 Feet Assistive device: None Gait Pattern/deviations: Step-through pattern Gait velocity: Decreased    General Gait Details: Slow, guarded gait, however, no LOB noted. Educated about generalized walking  program to perform at home.   Stairs            Wheelchair Mobility    Modified Rankin (Stroke Patients Only)       Balance Overall balance assessment: Needs assistance Sitting-balance support: No upper extremity supported;Feet supported Sitting balance-Leahy Scale: Normal     Standing balance support: No upper extremity supported;During functional activity Standing balance-Leahy Scale: Good                               Pertinent Vitals/Pain Pain Assessment: Faces Faces Pain Scale: Hurts even more Pain Location: back  Pain Descriptors / Indicators: Operative site guarding;Sore Pain Intervention(s): Limited activity within patient's tolerance;Monitored during session;Repositioned    Home Living Family/patient expects to be discharged to:: Private residence Living Arrangements: Other relatives Available Help at Discharge: Family;Available PRN/intermittently Type of Home: House Home Access: Level entry     Home Layout: Able to live on main level with bedroom/bathroom;Two level Home Equipment: Cane - single point;Walker - 2 wheels      Prior Function Level of Independence: Independent               Hand Dominance   Dominant Hand: Right    Extremity/Trunk Assessment   Upper Extremity Assessment Upper Extremity Assessment: Defer to OT evaluation    Lower Extremity Assessment Lower Extremity Assessment: RLE deficits/detail RLE Deficits / Details: Reports RLE pain had improved, however, still felt some pressure.     Cervical / Trunk Assessment Cervical / Trunk Assessment: Other exceptions Cervical / Trunk Exceptions: s/p lumbar surgery  Communication   Communication: No difficulties  Cognition Arousal/Alertness: Awake/alert Behavior During Therapy: WFL for tasks assessed/performed Overall Cognitive  Status: Within Functional Limits for tasks assessed                                        General Comments General  comments (skin integrity, edema, etc.): Pt's dad and brother present during session.     Exercises     Assessment/Plan    PT Assessment Patent does not need any further PT services  PT Problem List         PT Treatment Interventions      PT Goals (Current goals can be found in the Care Plan section)  Acute Rehab PT Goals Patient Stated Goal: to go home  PT Goal Formulation: With patient Time For Goal Achievement: 01/29/18 Potential to Achieve Goals: Good    Frequency     Barriers to discharge        Co-evaluation               AM-PAC PT "6 Clicks" Daily Activity  Outcome Measure Difficulty turning over in bed (including adjusting bedclothes, sheets and blankets)?: None Difficulty moving from lying on back to sitting on the side of the bed? : A Little Difficulty sitting down on and standing up from a chair with arms (e.g., wheelchair, bedside commode, etc,.)?: None Help needed moving to and from a bed to chair (including a wheelchair)?: None Help needed walking in hospital room?: None Help needed climbing 3-5 steps with a railing? : A Little 6 Click Score: 22    End of Session Equipment Utilized During Treatment: Gait belt Activity Tolerance: Patient tolerated treatment well Patient left: in bed;with call bell/phone within reach;with family/visitor present Nurse Communication: Mobility status PT Visit Diagnosis: Other abnormalities of gait and mobility (R26.89);Pain Pain - part of body: (back )    Time: [id number] PT Time Calculation (min) (ACUTE ONLY): 15 min   Charges:   PT Evaluation $PT Eval Low Complexity: 1 Low     PT G Codes:        Gladys DammeBrittany Celia Gibbons, PT, DPT  Acute Rehabilitation Services  Pager: [phone]   Lehman PromBrittany S Marlaya Turck 01/29/2018, 5:42 PM

## 2018-01-29 NOTE — Anesthesia Postprocedure Evaluation (Signed)
Anesthesia Post Note  Patient: Barbee ShropshireBrian Pettibone  Procedure(s) Performed: Microlumbar decompression Lumbar four-Five (N/A Spine Lumbar)     Patient location during evaluation: PACU Anesthesia Type: General Level of consciousness: awake and alert Pain management: pain level controlled Vital Signs Assessment: post-procedure vital signs reviewed and stable Respiratory status: spontaneous breathing, nonlabored ventilation, respiratory function stable and patient connected to nasal cannula oxygen Cardiovascular status: blood pressure returned to baseline and stable Postop Assessment: no apparent nausea or vomiting Anesthetic complications: no    Last Vitals:  Vitals:   01/29/18 1336 01/29/18 1358  BP: 129/81 110/90  Pulse: 62 76  Resp: 15 18  Temp: 36.7 C (!) 36.4 C  SpO2: 96% 100%    Last Pain:  Vitals:   01/29/18 1638  TempSrc:   PainSc: 4                  Ryan P Ellender

## 2018-01-29 NOTE — Anesthesia Procedure Notes (Signed)
Procedure Name: Intubation Performed by: Neldon Newport, CRNA Pre-anesthesia Checklist: Patient identified, Emergency Drugs available, Suction available and Patient being monitored Patient Re-evaluated:Patient Re-evaluated prior to induction Oxygen Delivery Method: Circle System Utilized Preoxygenation: Pre-oxygenation with 100% oxygen Induction Type: IV induction Ventilation: Mask ventilation without difficulty Laryngoscope Size: Mac and 3 Grade View: Grade I Tube type: Oral Tube size: 7.5 mm Number of attempts: 1 Airway Equipment and Method: Stylet and Oral airway Placement Confirmation: ETT inserted through vocal cords under direct vision,  positive ETCO2 and breath sounds checked- equal and bilateral Secured at: 23 cm Tube secured with: Tape Dental Injury: Teeth and Oropharynx as per pre-operative assessment

## 2018-01-29 NOTE — Op Note (Signed)
NAMEAVONTAE, Shawn White MEDICAL RECORD [id number] ACCOUNT [id number] DATE OF BIRTH:01-May-1981 FACILITY: [facility] LOCATION: [facility] PHYSICIAN:Edwinna Rochette Connye Burkitt, MD  OPERATIVE REPORT  DATE OF PROCEDURE:  01/29/2018  PREOPERATIVE DIAGNOSIS:  Spinal stenosis HNP L4-L5, right.  POSTOPERATIVE DIAGNOSIS:  Spinal stenosis pulposus L4-L5 right.  PROCEDURE PERFORMED: 1.  Microlumbar decompression L4-L5 right. 2.  Microdiskectomy 4-5 right. 3.  Hemilaminectomy L4 right.  ANESTHESIA:  General.  ASSISTANT:  Andrez Grime, PA   HISTORY:  This is a 37 year old male with disk herniation at 4-5 migrating cephalad compressed in the 4 root out into the foramen.  He also had scoliosis, increased lumbosacral angle and a grade I spondylolisthesis at L5-S1.  He was indicated for  microlumbar decompression with neural tension signs, quad weakness, tibialis anterior weakness and numbness.  Risks and benefits discussed including bleeding, infection, damage to neurovascular structures, no change in symptoms, worsening symptoms, DVT,  PE, anesthetic complications, etc.  TECHNIQUE:  The patient in supine position.  After induction of adequate anesthesia, 2 grams Kefzol placed prone on the [place] table with sling.  Hips flexed.  Abdomen free.  Lumbar region was prepped and draped in the usual sterile fashion.  Two  18-gauge spinal needles were utilized to localize the 4-5 interspace confirmed with x-rays.  Incision was made above the spinous process of 4 to below 5.  Subcutaneous tissue was dissected with electrocautery to achieve hemostasis.  Dorsal lumbar fascia  identified and divided in line with the skin incision was infiltrated with 0.25% Marcaine with epinephrine.  Paraspinous muscle elevated from the lamina of 4-5 and above the lamina of 4 as the fragment had migrated cephalad to above the pedicle of 4 and  near to the disk space at 3-4.  Confirmatory radiograph obtained.  Noted was a near vertical shingling  of the L4 lamina.  We therefore proceeded with first utilizing a straight microcurette to detach ligamentum flavum from the caudad edge of 4 and also  the cephalad edge of 4 as well.  I felt this would require removing the hemilamina of L4.  Next, we used a cutting osteophyte rongeur and a 2 mm Kerrison angled to remove the hemilamina.  I used a Woodson to develop a plane between the thecal sac and the  lamina.  This was then removed.  We then subsequently gently mobilized the thecal sac.  I also removed ligamentum flavum from the interspace at 4-5, protecting the thecal sac at all times.  Bipolar electrocautery was utilized to achieve hemostasis.  I  gently mobilized the thecal sac.  We continued cephalad to remove the ligamentum flavum at 3-4.  We identified the root of 4 and continued cephalad to get above the root of 4 into the shoulder.  Following this, we found the root compressed against the  lateral recess.  Above the root I was able to obtain a confirmatory radiograph of the foramen of 4.  We gently mobilized the thecal sac medially with a Penfield gently protected and the shoulder of the 4 root.  HNP an extrusion was noted and this was  meticulously mobilized with a small micro nerve hook and a micropituitary.  Five fragments were retrieved from beneath the thecal sac out into the foramen of 4 beneath the root of 4 and cephalad.  Epidural venous plexus was noted and cauterized.   Following these fragments, we checked down to the disk space and then cephalad the fragments beneath the root of 4 and down to the disk space.  Beneath  the thecal sac there was no residual disk herniation noted and also up to above the pedicle of 4.  I  felt we had removed all herniated material.  Following this, there was good excursion of the 4 root.  A Woodson retractor passed freely out the foramen of 4 and down at 5 and cephalad to 3.  Bone wax was placed on the cancellous surfaces.  A small patty  of thrombin-soaked  Gelfoam was placed in the laminotomy defect after copious irrigation.  No active CSF leakage or active bleeding.  I removed the McCullough retractor, irrigated the paraspinous musculature.  Closed the dorsal lumbar fascia #1 Vicryl,  subcutaneous with 2-0 and skin with Prolene.  Sterile dressing applied.  Placed supine on the hospital bed, extubated without difficulty and transported to the recovery room in satisfactory condition.  The patient tolerated the procedure well.  No complications.  Assistant Andrez GrimeJaclyn Bissell, [place].  Minimal blood loss.  Specimen L4-L5 disk to pathology.  TN/NUANCE  D:01/29/2018 T:01/29/2018 JOB:[id number]

## 2018-01-29 NOTE — Interval H&P Note (Signed)
History and Physical Interval Note:  01/29/2018 10:01 AM  Shawn ShropshireBrian Villamor  has presented today for surgery, with the diagnosis of HNP L4-5  The various methods of treatment have been discussed with the patient and family. After consideration of risks, benefits and other options for treatment, the patient has consented to  Procedure(s) with comments: Microlumbar decompression L4-5 (N/A) - 120 as a surgical intervention .  The patient's history has been reviewed, patient examined, no change in status, stable for surgery.  I have reviewed the patient's chart and labs.  Questions were answered to the patient's satisfaction.     Darby Fleeman C

## 2018-01-30 ENCOUNTER — Encounter (HOSPITAL_COMMUNITY): Payer: Self-pay | Admitting: Specialist

## 2018-01-30 DIAGNOSIS — M5126 Other intervertebral disc displacement, lumbar region: Secondary | ICD-10-CM | POA: Diagnosis not present

## 2018-01-30 MED ORDER — ACETAMINOPHEN 10 MG/ML IV SOLN
1000.0000 mg | INTRAVENOUS | Status: AC
Start: 2018-01-30 — End: 2018-01-30
  Administered 2018-01-30: 1000 mg via INTRAVENOUS
  Filled 2018-01-30: qty 100

## 2018-01-30 NOTE — Discharge Summary (Signed)
Physician Discharge Summary   Patient ID: Shawn White MRN: [id number] DOB/AGE: 1980-08-04 37 y.o.  Admit date: 01/29/2018 Discharge date: 01/30/2018  Primary Diagnosis:   HNP L4-5  Admission Diagnoses:  Past Medical History:  Diagnosis Date  . Arthritis   . Scoliosis    Discharge Diagnoses:   Active Problems:   HNP (herniated nucleus pulposus), lumbar  Procedure:  Procedure(s) (LRB): Microlumbar decompression Lumbar four-Five (N/A)   Consults: None  HPI:  see H&P    Laboratory Data: Hospital Outpatient Visit on 01/20/2018  Component Date Value Ref Range Status  . MRSA, PCR 01/20/2018 NEGATIVE  NEGATIVE Final  . Staphylococcus aureus 01/20/2018 NEGATIVE  NEGATIVE Final   Comment: (NOTE) The Xpert SA Assay (FDA approved for NASAL specimens in patients 46 years of age and older), is one component of a comprehensive surveillance program. It is not intended to diagnose infection nor to guide or monitor treatment. Performed at [facility], [place] [address], [place], [place] [address]   . Sodium 01/20/2018 140  135 - 145 mmol/L Final  . Potassium 01/20/2018 4.5  3.5 - 5.1 mmol/L Final  . Chloride 01/20/2018 107  98 - 111 mmol/L Final   Please note change in reference range.  . CO2 01/20/2018 25  22 - 32 mmol/L Final  . Glucose, Bld 01/20/2018 82  70 - 99 mg/dL Final   Please note change in reference range.  . BUN 01/20/2018 10  6 - 20 mg/dL Final   Please note change in reference range.  . Creatinine, Ser 01/20/2018 0.72  0.61 - 1.24 mg/dL Final  . Calcium 01/20/2018 8.9  8.9 - 10.3 mg/dL Final  . GFR calc non Af Amer 01/20/2018 >60  >60 mL/min Final  . GFR calc Af Amer 01/20/2018 >60  >60 mL/min Final   Comment: (NOTE) The eGFR has been calculated using the CKD EPI equation. This calculation has not been validated in all clinical situations. eGFR's persistently <60 mL/min signify possible Chronic Kidney Disease.   Georgiann Hahn gap 01/20/2018 8  5 - 15  Final   Performed at [facility], [place] [address] [place], [place] [address]  . WBC 01/20/2018 7.0  4.0 - 10.5 K/uL Final  . RBC 01/20/2018 4.79  4.22 - 5.81 MIL/uL Final  . Hemoglobin 01/20/2018 14.5  13.0 - 17.0 g/dL Final  . HCT 01/20/2018 45.0  39.0 - 52.0 % Final  . MCV 01/20/2018 93.9  78.0 - 100.0 fL Final  . MCH 01/20/2018 30.3  26.0 - 34.0 pg Final  . MCHC 01/20/2018 32.2  30.0 - 36.0 g/dL Final  . RDW 01/20/2018 12.9  11.5 - 15.5 % Final  . Platelets 01/20/2018 363  150 - 400 K/uL Final   Performed at [facility], [place] [address], [place], [place] [address]   No results for input(s): HGB in the last 72 hours. No results for input(s): WBC, RBC, HCT, PLT in the last 72 hours. No results for input(s): NA, K, CL, CO2, BUN, CREATININE, GLUCOSE, CALCIUM in the last 72 hours. No results for input(s): LABPT, INR in the last 72 hours.  X-Rays:Dg Lumbar Spine 2-3 Views  Result Date: 01/29/2018 CLINICAL DATA:  37 year old male with a history of lumbar surgery. EXAM: LUMBAR SPINE - 2-3 VIEW COMPARISON:  01/20/2018 FINDINGS: Intraoperative sequential cross-table lateral the lumbar spine. L5 pars defect. Initial image demonstrates fiducial needles overlying the L4 and L5 spinous process. Sequential images demonstrate placement of tissue retractor at  the level of the L4 spinous process, with the fourth image demonstrating superior surgical curette at the L4 pedicle and the inferior curette at the L4 foramen. IMPRESSION: Sequential intraoperative cross-table lateral of the lumbar spine, with the final image identifying the L4 foramen as above. Please refer to the dictated operative report for full details of intraoperative findings and procedure. Electronically Signed   By: Corrie Mckusick D.O.   On: 01/29/2018 13:03   Dg Lumbar Spine 2-3 Views  Result Date: 01/20/2018 CLINICAL DATA:  Preop for surgery at L4-5 EXAM: LUMBAR SPINE - 2-3 VIEW COMPARISON:  None. FINDINGS: There is minimal  curvature of the lumbar spine convex to the left by 5 degrees. The lumbar vertebrae appear to be in normal position with relatively normal intervertebral disc spaces. There is some degenerative disc disease at T11-T12 and T12-L1. Otherwise intervertebral disc spaces appear normal. No definite compression deformity is seen. The SI joints appear corticated. The bowel gas pattern is nonspecific. IMPRESSION: 1. Normal alignment of the lumbar vertebrae with mild degenerative change of the lower thoracic spine. 2. No acute compression deformity. Electronically Signed   By: Ivar Drape M.D.   On: 01/20/2018 09:56    EKG:No orders found for this or any previous visit.   Hospital Course: Patient was admitted to [facility] and taken to the OR and underwent the above state procedure without complications.  Patient tolerated the procedure well and was later transferred to the recovery room and then to the orthopaedic floor for postoperative care.  They were given PO and IV analgesics for pain control following their surgery.  They were given 24 hours of postoperative antibiotics.   PT was consulted postop to assist with mobility and transfers.  The patient was allowed to be WBAT with therapy and was taught back precautions. Discharge planning was consulted to help with postop disposition and equipment needs.  Patient had a good night on the evening of surgery and started to get up OOB with therapy on day one. Patient was seen in rounds and was ready to go home on day one.  They were given discharge instructions and dressing directions.  Dressing was changed prior to D/C due to saturation. No active bleeding was noted. They were instructed on when to follow up in the office with Dr. Tonita Cong.   Diet: Regular diet Activity:WBAT with Lspine precautions Follow-up:in 10-14 days Disposition - Home Discharged Condition: good   Discharge Instructions    Call MD / Call 911   Complete by:  As directed    If you  experience chest pain or shortness of breath, CALL 911 and be transported to the hospital emergency room.  If you develope a fever above 101 F, pus (white drainage) or increased drainage or redness at the wound, or calf pain, call your surgeon's office.   Constipation Prevention   Complete by:  As directed    Drink plenty of fluids.  Prune juice may be helpful.  You may use a stool softener, such as Colace (over the counter) 100 mg twice a day.  Use MiraLax (over the counter) for constipation as needed.   Diet - low sodium heart healthy   Complete by:  As directed    Increase activity slowly as tolerated   Complete by:  As directed      Allergies as of 01/30/2018      Reactions   Gabapentin    Anxiety, felt weird       Medication List  STOP taking these medications   HYDROcodone-acetaminophen 5-325 MG tablet Commonly known as:  NORCO/VICODIN     TAKE these medications   docusate sodium 100 MG capsule Commonly known as:  COLACE Take 1 capsule (100 mg total) by mouth 2 (two) times daily as needed.   LORazepam 0.5 MG tablet Commonly known as:  ATIVAN Take 0.5 mg by mouth 3 (three) times daily as needed for anxiety.   methocarbamol 500 MG tablet Commonly known as:  ROBAXIN Take 1 tablet (500 mg total) by mouth every 6 (six) hours as needed for muscle spasms. What changed:  when to take this   oxyCODONE 5 MG immediate release tablet Commonly known as:  ROXICODONE Take 1-2 tablets (5-10 mg total) by mouth every 4 (four) hours as needed.   polyethylene glycol packet Commonly known as:  MIRALAX Take 17 g by mouth daily.      Follow-up Information    Susa Day, MD Follow up in 2 week(s).   Specialty:  Orthopedic Surgery Contact information: [address] [place] [address] [phone]           Signed: Lacie Draft, PA-C Orthopaedic Surgery 01/30/2018, 8:10 AM

## 2018-01-30 NOTE — Progress Notes (Signed)
Discharge instructions discussed with pt and family at the bedside. All verbalized understanding of medication administration, follow up appointment and when to return to hospital or call doctor for signs of infection or any other emergencies.

## 2018-01-30 NOTE — Progress Notes (Signed)
Subjective: 1 Day Post-Op Procedure(s) (LRB): Microlumbar decompression Lumbar four-Five (N/A) Patient reports pain as mild. No leg pain. Reports incisional pain. Voiding without difficulty. No N/V. No other c/o. Ready to go home.  Objective: Vital signs in last 24 hours: Temp:  [97.4 F (36.3 C)-98.5 F (36.9 C)] 98.2 F (36.8 C) (07/12 0400) Pulse Rate:  [60-84] 67 (07/12 0400) Resp:  [11-20] 13 (07/12 0400) BP: (107-141)/(61-90) 107/61 (07/12 0400) SpO2:  [96 %-100 %] 98 % (07/12 0400) Weight:  [77.1 kg (170 lb)] 77.1 kg (170 lb) (07/11 0816)  Intake/Output from previous day: 07/11 0701 - 07/12 0700 In: 2410 [P.O.:360; I.V.:1750; IV Piggyback:300] Out: 100 [Blood:100] Intake/Output this shift: No intake/output data recorded.  No results for input(s): HGB in the last 72 hours. No results for input(s): WBC, RBC, HCT, PLT in the last 72 hours. No results for input(s): NA, K, CL, CO2, BUN, CREATININE, GLUCOSE, CALCIUM in the last 72 hours. No results for input(s): LABPT, INR in the last 72 hours.  Neurologically intact ABD soft Neurovascular intact Sensation intact distally Intact pulses distally Dorsiflexion/Plantar flexion intact Incision: aquacel saturated, had been reinforced, new dressing dry. dressing removed, no active drainage, no erythema, no sign of infection, minimal tenderness. mild swelling at incision site. new dressing placed. No cellulitis present Compartment soft   Assessment/Plan: 1 Day Post-Op Procedure(s) (LRB): Microlumbar decompression Lumbar four-Five (N/A) Advance diet Up with therapy D/C IV fluids Dressing changed Discussed D/C instructions, dressing instructions, Lspine precautions Plan D/C home today Discussed smoking cessation Discussed with Dr. Elissa LovettBeane   Abi Shoults M. 01/30/2018, 8:07 AM

## 2018-01-30 NOTE — Evaluation (Addendum)
Occupational Therapy Evaluation Patient Details Name: Shawn White MRN: [id number] DOB: Nov 17, 1980 Today's Date: 01/30/2018    History of Present Illness Pt is a 37 y/o male s/p L4-5 microlumbar decompression and microdiskectomy.    Clinical Impression   PTA patient independent and working.  Currently requires supervision assistance for ADL to adapt and compensate for back precautions.  Demonstrates good safety, techniques, energy conservation, pain management, recall of precautions and modified techniques for ADLs after education.  Reviewed handout with patient. Patient dcing home today with brothers support.  Agreeable to recommendations (see below).  No further OT needs required.  Thank you for this referral! OT signing off.     Follow Up Recommendations  No OT follow up;Supervision - Intermittent    Equipment Recommendations  None recommended by OT    Recommendations for Other Services       Precautions / Restrictions Precautions Precautions: Back Precaution Booklet Issued: No(issued in prior PT session) Precaution Comments: Reviewed back precautions with pt and family.  Restrictions Weight Bearing Restrictions: No      Mobility Bed Mobility Overal bed mobility: Needs Assistance Bed Mobility: Sidelying to Sit;Sit to Sidelying   Sidelying to sit: Supervision     Sit to sidelying: Supervision General bed mobility comments: Supervision to ensure log roll technique, min cueing to complete log roll technique.   Transfers Overall transfer level: Modified independent Equipment used: None Transfers: Sit to/from Stand Sit to Stand: Modified independent (Device/Increase time)         General transfer comment: good safety and technique    Balance Overall balance assessment: No apparent balance deficits (not formally assessed)                                         ADL either performed or assessed with clinical judgement   ADL Overall ADL's :  Needs assistance/impaired Eating/Feeding: Independent   Grooming: Supervision/safety;Cueing for compensatory techniques;Standing   Upper Body Bathing: Modified independent;Sitting   Lower Body Bathing: Supervison/ safety;Cueing for compensatory techniques;Sit to/from stand Lower Body Bathing Details (indicate cue type and reason): educated on figure 4 technique for back precautions, bathing seated for safety and having someone nearby initally  Upper Body Dressing : Modified independent;Sitting   Lower Body Dressing: Supervision/safety;Cueing for compensatory techniques;Cueing for back precautions;Sit to/from stand Lower Body Dressing Details (indicate cue type and reason): educated on figure 4 technique to access LEs adhering to precautions Toilet Transfer: Modified Independent;Ambulation;Comfort height toilet   Toileting- Clothing Manipulation and Hygiene: Supervision/safety;Cueing for compensatory techniques;Sit to/from stand Toileting - Clothing Manipulation Details (indicate cue type and reason): reviewed techniques for toileting to adhere to precautions Tub/ Shower Transfer: Modified independent;Ambulation;Shower Field seismologistseat Tub/Shower Transfer Details (indicate cue type and reason): good safety and mobility techniques adhering to precautions  Functional mobility during ADLs: Independent General ADL Comments: patient with good awareness of precautions and safety, educated on impact of precautions to ADLs and compesnatory techniques      Vision   Vision Assessment?: No apparent visual deficits     Perception     Praxis      Pertinent Vitals/Pain Pain Assessment: Faces Faces Pain Scale: Hurts little more Pain Location: back  Pain Descriptors / Indicators: Operative site guarding;Sore Pain Intervention(s): Monitored during session     Hand Dominance Right   Extremity/Trunk Assessment Upper Extremity Assessment Upper Extremity Assessment: Overall WFL for tasks assessed   Lower  Extremity Assessment Lower Extremity Assessment: Defer to PT evaluation   Cervical / Trunk Assessment Cervical / Trunk Assessment: Other exceptions Cervical / Trunk Exceptions: s/p lumbar surgery   Communication Communication Communication: No difficulties   Cognition Arousal/Alertness: Awake/alert Behavior During Therapy: WFL for tasks assessed/performed Overall Cognitive Status: Within Functional Limits for tasks assessed                                     General Comments  dad and brother present    Exercises     Shoulder Instructions      Home Living Family/patient expects to be discharged to:: Private residence Living Arrangements: Other relatives(brother) Available Help at Discharge: Family;Available PRN/intermittently Type of Home: House Home Access: Level entry     Home Layout: Able to live on main level with bedroom/bathroom;Two level     Bathroom Shower/Tub: Producer, television/film/video: Handicapped height     Home Equipment: Cane - single point;Walker - 2 wheels          Prior Functioning/Environment Level of Independence: Independent        Comments: independent and working        OT Problem List: Decreased activity tolerance;Decreased safety awareness;Decreased knowledge of precautions;Pain      OT Treatment/Interventions:      OT Goals(Current goals can be found in the care plan section) Acute Rehab OT Goals Patient Stated Goal: home today OT Goal Formulation: With patient  OT Frequency:     Barriers to D/C:            Co-evaluation              AM-PAC PT "6 Clicks" Daily Activity     Outcome Measure Help from another person eating meals?: None Help from another person taking care of personal grooming?: None Help from another person toileting, which includes using toliet, bedpan, or urinal?: None Help from another person bathing (including washing, rinsing, drying)?: None Help from another person to  put on and taking off regular upper body clothing?: None Help from another person to put on and taking off regular lower body clothing?: None 6 Click Score: 24   End of Session    Activity Tolerance: Patient tolerated treatment well;No increased pain Patient left: with family/visitor present(seated EOB )  OT Visit Diagnosis: Other abnormalities of gait and mobility (R26.89);Pain Pain - part of body: (back)                Time: 9528-4132 OT Time Calculation (min): 11 min Charges:  OT General Charges $OT Visit: 1 Visit OT Evaluation $OT Eval Low Complexity: 1 Low G-Codes:     Chancy Milroy, OTR/L  Pager [phone]   Chancy Milroy 01/30/2018, 10:09 AM

## 2019-07-31 IMAGING — CR DG LUMBAR SPINE 2-3V
2 series · 2 of 2 positions shown · non-contrast
Comparison: None.

CLINICAL DATA: Preop for surgery at L4-5

EXAM:
LUMBAR SPINE - 2-3 VIEW

[w lumbar spine ap (1 of 2)]
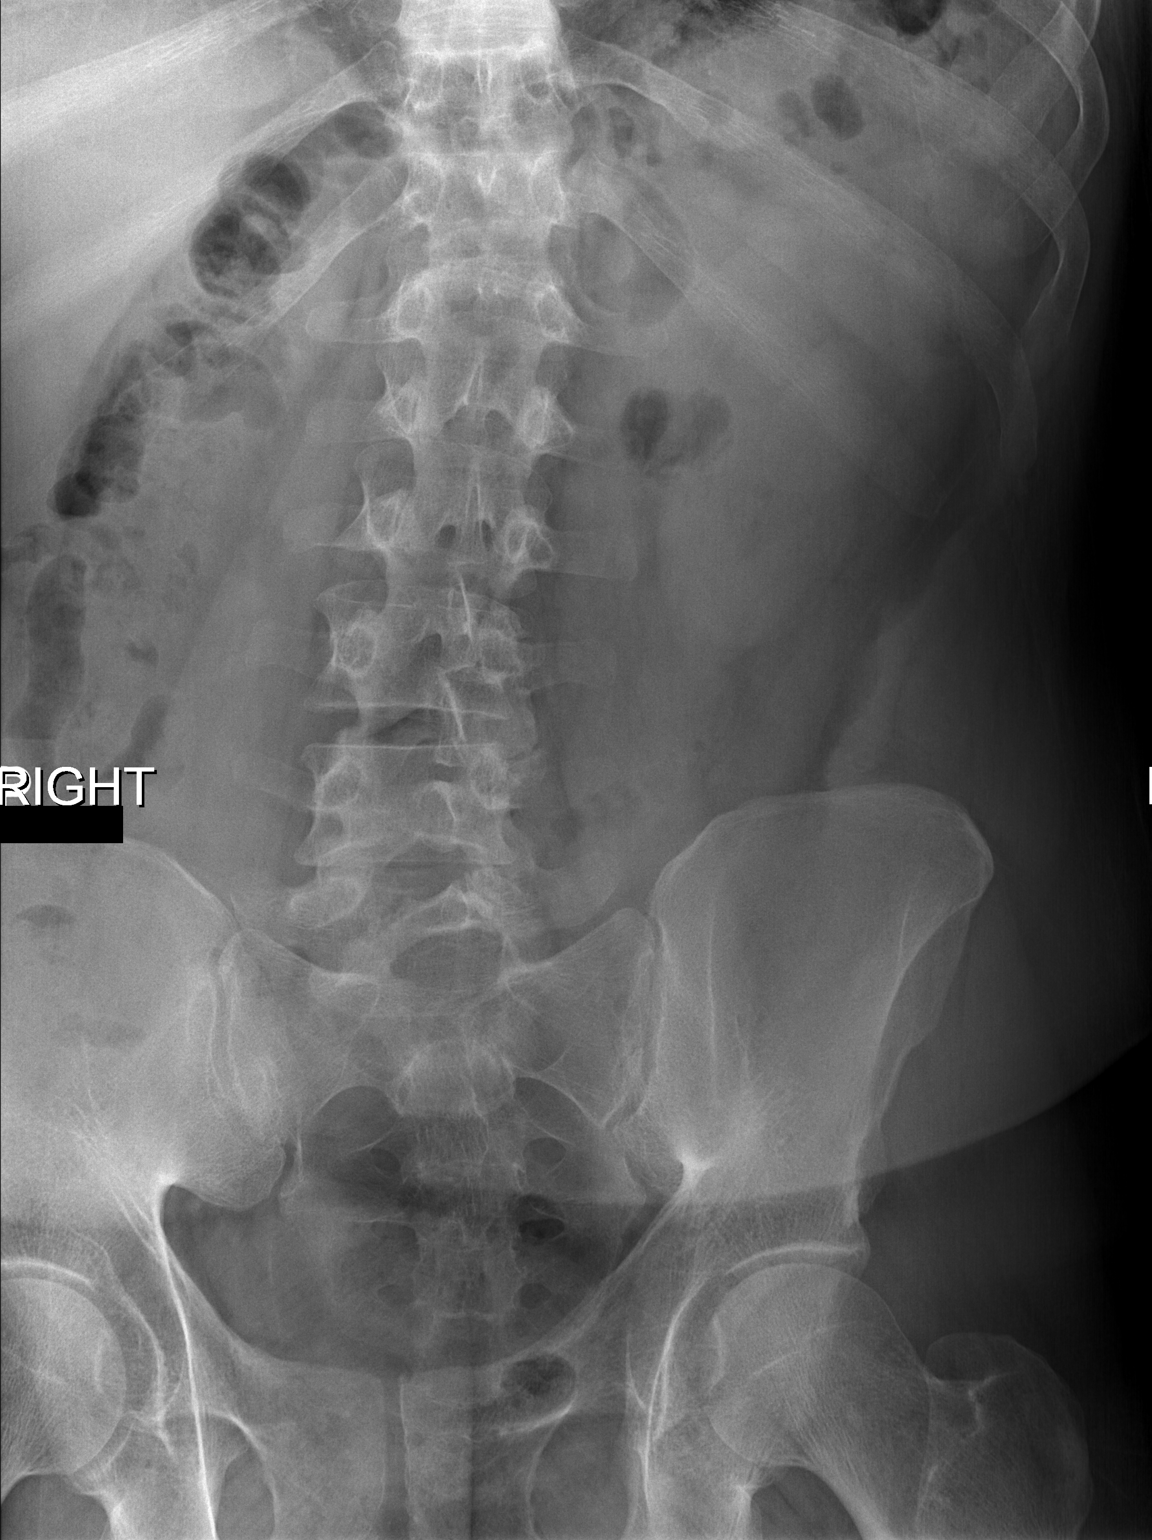

[w lumbar spine ap (2 of 2)]
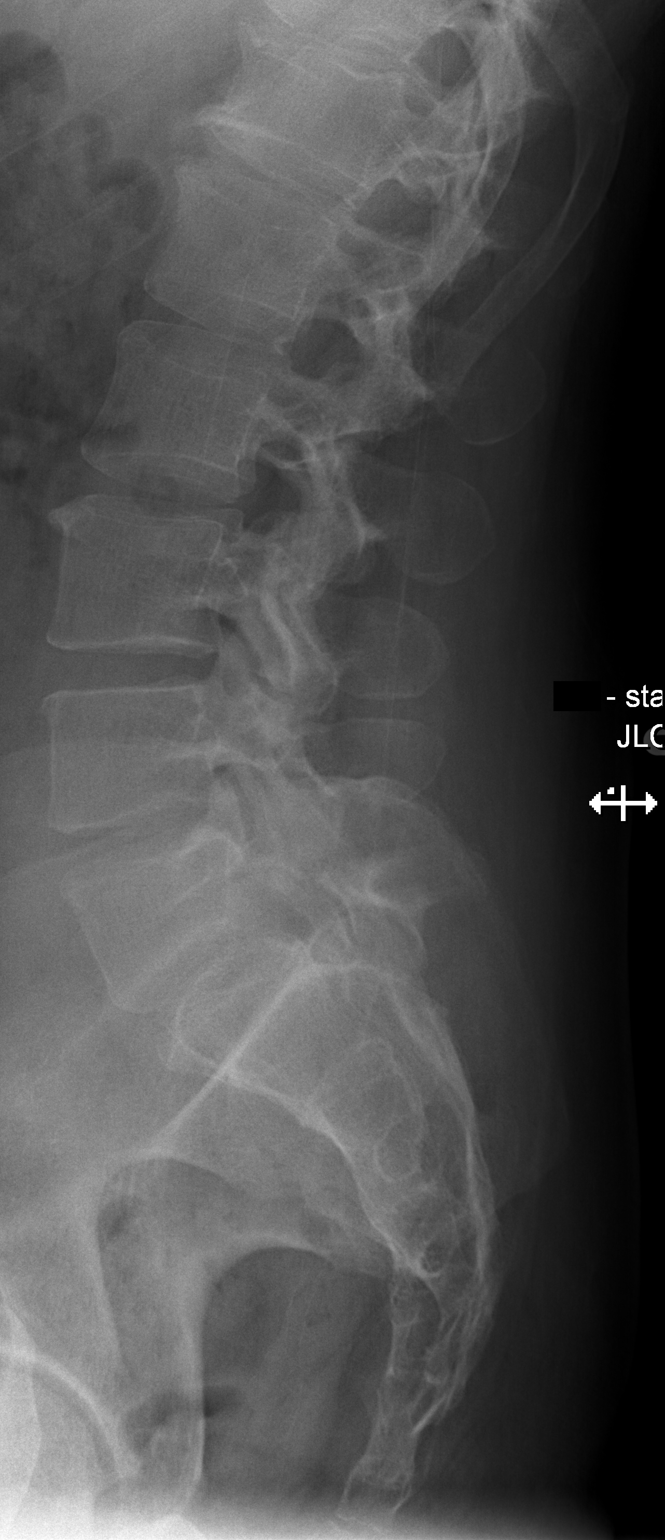

[2 of 2 positions shown; findings below may reference images not displayed]

FINDINGS: There is minimal curvature of the lumbar spine convex to the left by
5 degrees. The lumbar vertebrae appear to be in normal position with
relatively normal intervertebral disc spaces. There is some
degenerative disc disease at T11-T12 and T12-L1. Otherwise
intervertebral disc spaces appear normal. No definite compression
deformity is seen. The SI joints appear corticated. The bowel gas
pattern is nonspecific.
IMPRESSION: 1. Normal alignment of the lumbar vertebrae with mild degenerative
change of the lower thoracic spine.
2. No acute compression deformity.

## 2019-08-09 IMAGING — CR DG LUMBAR SPINE 2-3V
4 series · 4 of 4 positions shown · non-contrast
Comparison: 01/20/2018

CLINICAL DATA: 36-year-old male with a history of lumbar surgery.

EXAM:
LUMBAR SPINE - 2-3 VIEW

[lateral (1 of 4)]
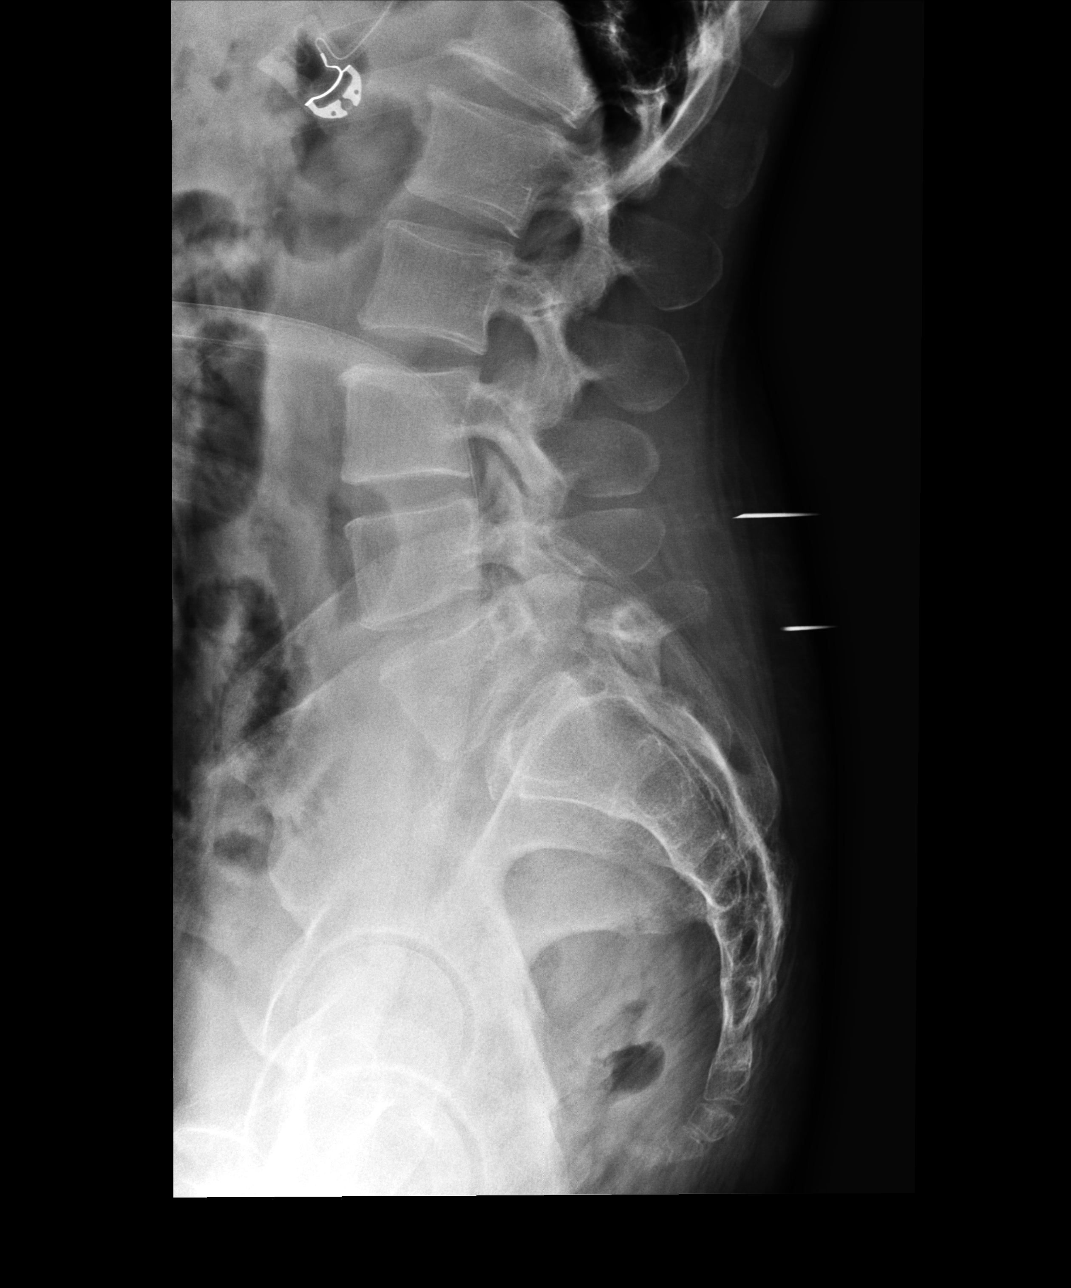

[lateral (2 of 4)]
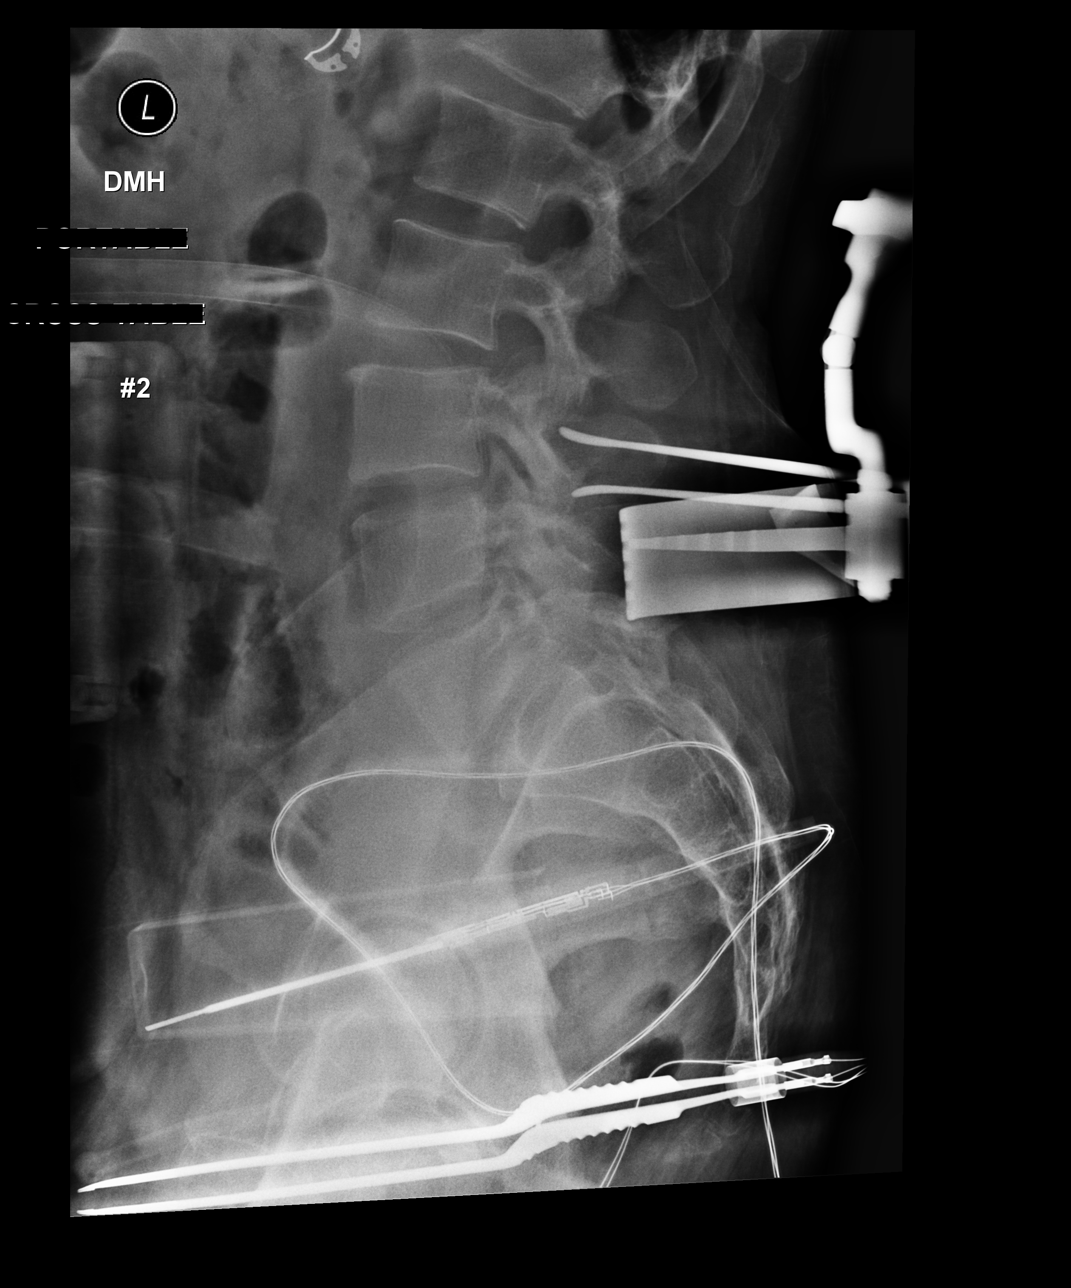

[lateral (3 of 4)]
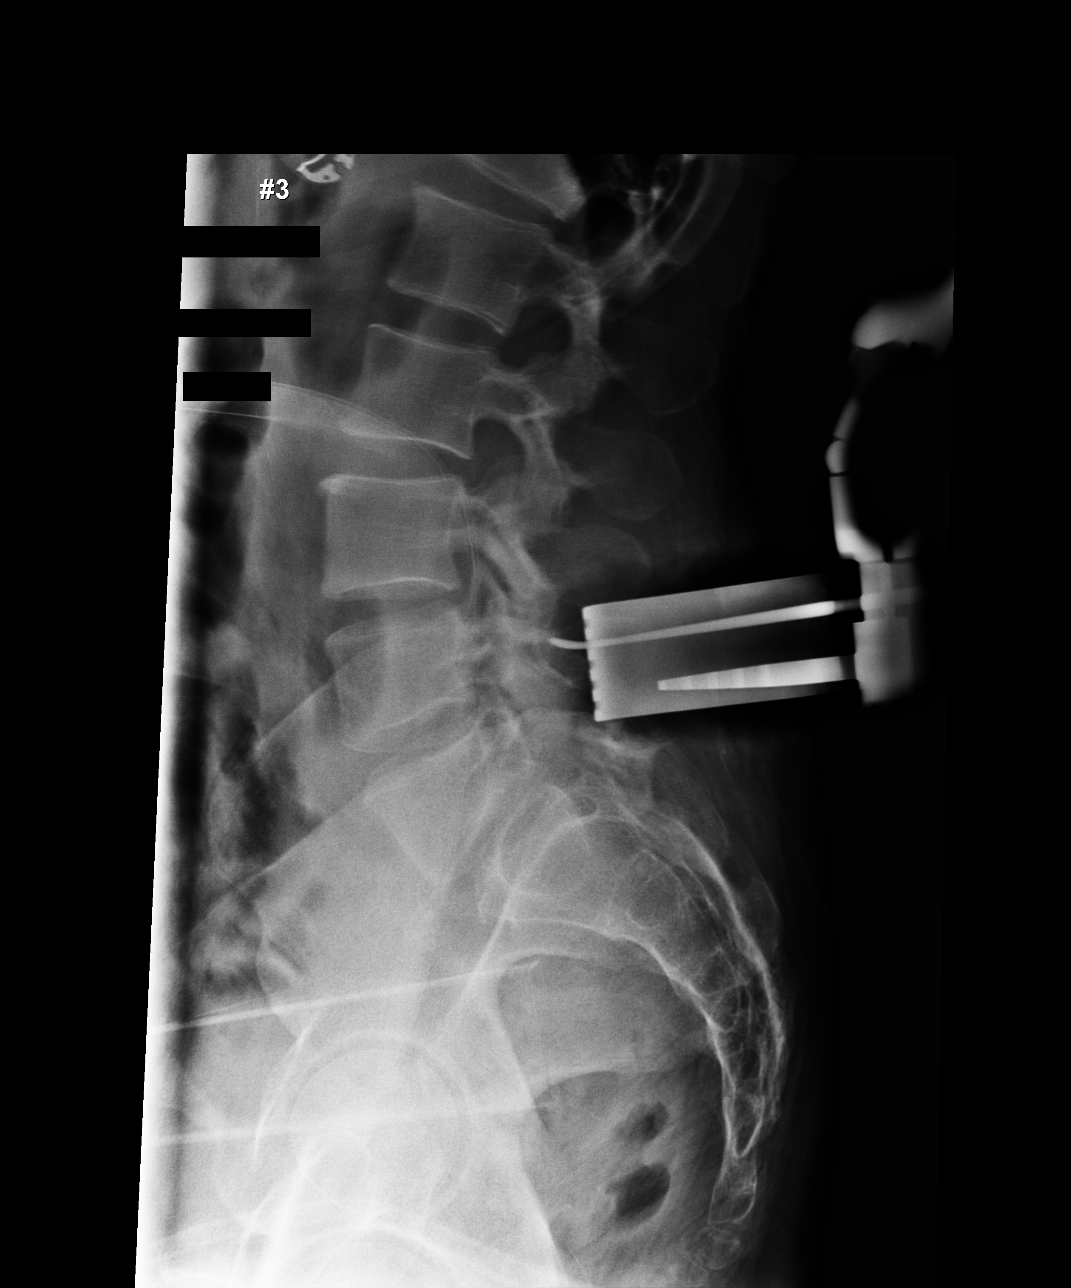

[lateral (4 of 4)]
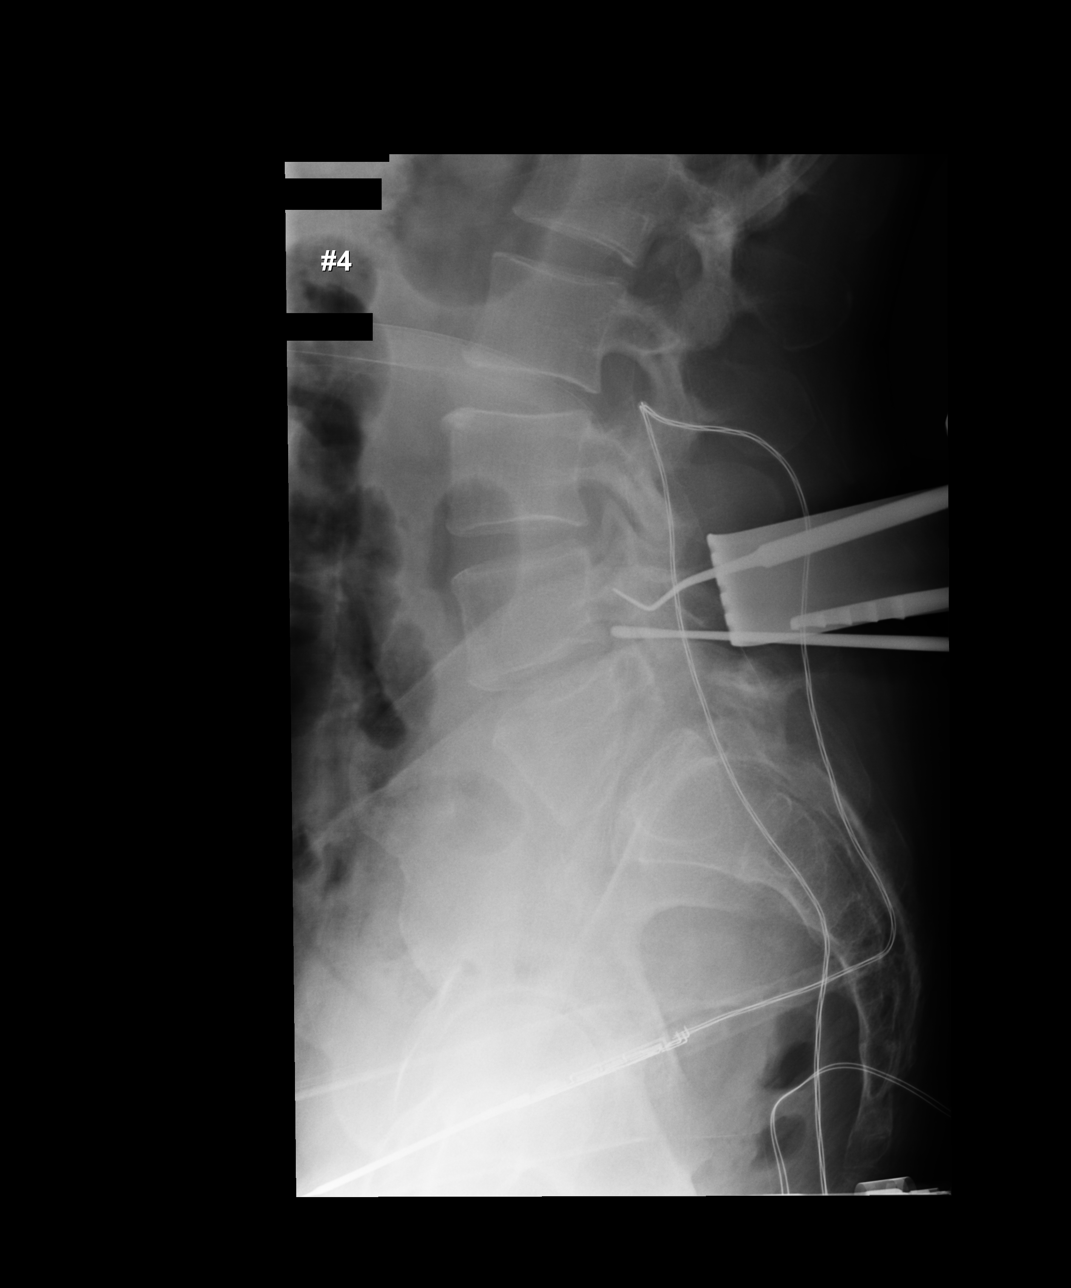

[4 of 4 positions shown; findings below may reference images not displayed]

FINDINGS: Intraoperative sequential cross-table lateral the lumbar spine. L5
pars defect.

Initial image demonstrates fiducial needles overlying the L4 and L5
spinous process.

Sequential images demonstrate placement of tissue retractor at the
level of the L4 spinous process, with the fourth image demonstrating
superior surgical curette at the L4 pedicle and the inferior curette
at the L4 foramen.
IMPRESSION: Sequential intraoperative cross-table lateral of the lumbar spine,
with the final image identifying the L4 foramen as above. Please
refer to the dictated operative report for full details of
intraoperative findings and procedure.
